# Patient Record
Sex: Male | Born: 1970 | Race: Black or African American | Hispanic: No | Marital: Single | Smoking: Current every day smoker
Health system: Southern US, Community
[De-identification: ages and names within clinical notes are randomized; demographics above are authoritative.]

## PROBLEM LIST (undated history)

## (undated) DIAGNOSIS — E7800 Pure hypercholesterolemia, unspecified: Secondary | ICD-10-CM

## (undated) DIAGNOSIS — Z5900 Homelessness unspecified: Secondary | ICD-10-CM

## (undated) DIAGNOSIS — I10 Essential (primary) hypertension: Secondary | ICD-10-CM

## (undated) DIAGNOSIS — F319 Bipolar disorder, unspecified: Secondary | ICD-10-CM

## (undated) DIAGNOSIS — G4730 Sleep apnea, unspecified: Secondary | ICD-10-CM

## (undated) DIAGNOSIS — F32A Depression, unspecified: Secondary | ICD-10-CM

## (undated) DIAGNOSIS — F329 Major depressive disorder, single episode, unspecified: Secondary | ICD-10-CM

---

## 2014-10-08 ENCOUNTER — Encounter (HOSPITAL_COMMUNITY): Payer: Self-pay | Admitting: Nurse Practitioner

## 2014-10-08 ENCOUNTER — Emergency Department (HOSPITAL_COMMUNITY): Payer: Self-pay

## 2014-10-08 ENCOUNTER — Emergency Department (HOSPITAL_COMMUNITY)
Admission: EM | Admit: 2014-10-08 | Discharge: 2014-10-08 | Disposition: A | Discharge status: Home / Self Care | Payer: Self-pay | Attending: Emergency Medicine | Admitting: Emergency Medicine

## 2014-10-08 DIAGNOSIS — Z8669 Personal history of other diseases of the nervous system and sense organs: Secondary | ICD-10-CM | POA: Insufficient documentation

## 2014-10-08 DIAGNOSIS — Z8639 Personal history of other endocrine, nutritional and metabolic disease: Secondary | ICD-10-CM | POA: Insufficient documentation

## 2014-10-08 DIAGNOSIS — Z720 Tobacco use: Secondary | ICD-10-CM | POA: Insufficient documentation

## 2014-10-08 DIAGNOSIS — I10 Essential (primary) hypertension: Secondary | ICD-10-CM | POA: Insufficient documentation

## 2014-10-08 DIAGNOSIS — J069 Acute upper respiratory infection, unspecified: Secondary | ICD-10-CM | POA: Insufficient documentation

## 2014-10-08 HISTORY — DX: Essential (primary) hypertension: I10

## 2014-10-08 HISTORY — DX: Sleep apnea, unspecified: G47.30

## 2014-10-08 HISTORY — DX: Pure hypercholesterolemia, unspecified: E78.00

## 2014-10-08 LAB — CBC
HEMATOCRIT: 40.2 % (ref 39.0–52.0)
Hemoglobin: 13.6 g/dL (ref 13.0–17.0)
MCH: 27.6 pg (ref 26.0–34.0)
MCHC: 33.8 g/dL (ref 30.0–36.0)
MCV: 81.5 fL (ref 78.0–100.0)
PLATELETS: 279 10*3/uL (ref 150–400)
RBC: 4.93 MIL/uL (ref 4.22–5.81)
RDW: 13.1 % (ref 11.5–15.5)
WBC: 8.4 10*3/uL (ref 4.0–10.5)

## 2014-10-08 LAB — BASIC METABOLIC PANEL
Anion gap: 9 (ref 5–15)
BUN: 8 mg/dL (ref 6–20)
CALCIUM: 9.1 mg/dL (ref 8.9–10.3)
CO2: 25 mmol/L (ref 22–32)
CREATININE: 1.19 mg/dL (ref 0.61–1.24)
Chloride: 105 mmol/L (ref 101–111)
GFR calc non Af Amer: >60 mL/min (ref >60)
Glucose, Bld: 96 mg/dL (ref 65–99)
Potassium: 3.4 mmol/L — ABNORMAL LOW (ref 3.5–5.1)
Sodium: 139 mmol/L (ref 135–145)

## 2014-10-08 NOTE — ED Notes (Signed)
Pt discharged by Buel ReamSara Slack, RN

## 2014-10-08 NOTE — ED Provider Notes (Signed)
CSN: [id number]     Arrival date & time 10/08/14  1114 History   First MD Initiated Contact with Patient 10/08/14 1543     Chief Complaint  Patient presents with  . Dizziness  . Cough     (Consider location/radiation/quality/duration/timing/severity/associated sxs/prior Treatment) HPI   Henry Hicks is a 44 y.o. male who presents for evaluation of rhinorrhea and cough present for 7 days. This morning at work, he felt dizzy, described as lightheaded, when bending over. He denies face pain, fever, chills, nausea, vomiting, isolated weakness or paresthesia. He has a history of hypertension, not currently being treated. He smokes cigarettes. He is been laid off from work until 2 days ago when he started working at [employer] job. Today before he felt dizzy, he noticed that an order from some of the metal pieces, he was working on, was bothering him. He denies chest pain, headache or back pain. He is essentially homeless now, but staying with a "married woman." He has used Xanax in the past, but does not have a current prescription for it. There are no other known modifying factors.   Past Medical History  Diagnosis Date  . Hypertension   . Hypercholesteremia   . Sleep apnea    History reviewed. No pertinent past surgical history. History reviewed. No pertinent family history. History  Substance Use Topics  . Smoking status: Current Every Day Smoker    Types: Cigarettes  . Smokeless tobacco: Not on file  . Alcohol Use: No    Review of Systems  All other systems reviewed and are negative.     Allergies  Review of patient's allergies indicates no known allergies.  Home Medications   Prior to Admission medications   Not on File   BP 142/90 mmHg  Pulse 79  Temp(Src) 98.7 F (37.1 C) (Oral)  Resp 18  Ht  (1.727 m)  Wt 200 lb 8 oz (90.946 kg)  BMI 30.49 kg/m2  SpO2 99% Physical Exam  Constitutional: He is oriented to person, place, and time. He appears  well-developed and well-nourished.  HENT:  Head: Normocephalic and atraumatic.  Right Ear: External ear normal.  Left Ear: External ear normal.  Eyes: Conjunctivae and EOM are normal. Pupils are equal, round, and reactive to light.  Neck: Normal range of motion and phonation normal. Neck supple.  Cardiovascular: Normal rate, regular rhythm and normal heart sounds.   Pulmonary/Chest: Effort normal and breath sounds normal. No respiratory distress. He exhibits no bony tenderness.  No wheezes, Rales or rhonchi. Good air bilaterally.  Abdominal: Soft. There is no tenderness.  Musculoskeletal: Normal range of motion.  Neurological: He is alert and oriented to person, place, and time. No cranial nerve deficit or sensory deficit. He exhibits normal muscle tone. Coordination normal.  Skin: Skin is warm, dry and intact.  Psychiatric: He has a normal mood and affect. His behavior is normal. Judgment and thought content normal.  Nursing note and vitals reviewed.   ED Course  Procedures (including critical care time)  Medications - No data to display  Patient Vitals for the past 24 hrs:  BP Temp Temp src Pulse Resp SpO2 Height Weight  10/08/14 1601 142/90 mmHg 98.7 F (37.1 C) Oral 79 18 99 % - -  10/08/14 1557 - - - - - -  (1.727 m) 200 lb 8 oz (90.946 kg)  10/08/14 1119 141/80 mmHg 98.2 F (36.8 C) Oral 90 16 97 % - -    4:15 PM  Reevaluation with update and discussion. After initial assessment and treatment, an updated evaluation reveals he is comfortable. Findings discussed with patient, all questions answered.Flint Melter. Tennyson Kallen L    Labs Review Labs Reviewed  BASIC METABOLIC PANEL - Abnormal; Notable for the following:    Potassium 3.4 (*)    All other components within normal limits  CBC    Imaging Review Dg Chest 2 View  10/08/2014   CLINICAL DATA:  Productive cough for 1 week  EXAM: CHEST  2 VIEW  COMPARISON:  None.  FINDINGS: Cardiac shadow is within normal limits. The  lungs are clear bilaterally. Scoliosis of the thoracic spine is noted concave to the right superiorly concave to the left inferiorly a safety pin is noted on the frontal film which projects adjacent to posterior chest wall.  IMPRESSION: No acute abnormality noted.   Electronically Signed   By: Alcide CleverMark  Lukens M.D.   On: 10/08/2014 12:15     EKG Interpretation None      MDM   Final diagnoses:  URI (upper respiratory infection)    Nonspecific respiratory symptoms, differential diagnosis includes virus and allergy. Doubt bacterial infection, pneumonia, or acute sinusitis. Stable psychiatric state, with history of anxiety, and homelessness, currently.  Nursing Notes Reviewed/ Care Coordinated Applicable Imaging Reviewed Interpretation of Laboratory Data incorporated into ED treatment  The patient appears reasonably screened and/or stabilized for discharge and I doubt any other medical condition or other [facility] requiring further screening, evaluation, or treatment in the ED at this time prior to discharge.  Plan: Home Medications- OTC antihistamine; Home Treatments- rest, fluids; return here if the recommended treatment, does not improve the symptoms; Recommended follow up- PCP 2-3 weeks. [facility] for Psychiatric evaluation and stress management.     Mancel BaleElliott Rasmus Preusser, MD 10/08/14 [phone]

## 2014-10-08 NOTE — ED Notes (Signed)
He states he has not felt well this past week, hes had a productive cough with yellow sputum and feeling lightheaded and nauseated.  He feels like his BP might be elevated. He has not taken his BP medication for 3 years. He is A&Ox4, resp e/u

## 2014-10-08 NOTE — Discharge Instructions (Signed)
The cause of your dizziness, is not clear. It will help to drink plenty of fluids. Consider trying an anti-histamine such as Benadryl or Zyrtec, see if it helps your cough and runny nose. Also, follow-up at [facility], for a checkup, regarding your stress and nervousness. You should also use our resource guide to help you find a primary care doctor to see for ongoing management. You should try to have your blood pressure and cholesterol checked in the next few weeks.    Upper Respiratory Infection, Adult An upper respiratory infection (URI) is also sometimes known as the common cold. The upper respiratory tract includes the nose, sinuses, throat, trachea, and bronchi. Bronchi are the airways leading to the lungs. Most people improve within 1 week, but symptoms can last up to 2 weeks. A residual cough may last even longer.  CAUSES Many different viruses can infect the tissues lining the upper respiratory tract. The tissues become irritated and inflamed and often become very moist. Mucus production is also common. A cold is contagious. You can easily spread the virus to others by oral contact. This includes kissing, sharing a glass, coughing, or sneezing. Touching your mouth or nose and then touching a surface, which is then touched by another person, can also spread the virus. SYMPTOMS  Symptoms typically develop 1 to 3 days after you come in contact with a cold virus. Symptoms vary from person to person. They may include:  Runny nose.  Sneezing.  Nasal congestion.  Sinus irritation.  Sore throat.  Loss of voice (laryngitis).  Cough.  Fatigue.  Muscle aches.  Loss of appetite.  Headache.  Low-grade fever. DIAGNOSIS  You might diagnose your own cold based on familiar symptoms, since most people get a cold 2 to 3 times a year. Your caregiver can confirm this based on your exam. Most importantly, your caregiver can check that your symptoms are not due to another disease such as strep  throat, sinusitis, pneumonia, asthma, or epiglottitis. Blood tests, throat tests, and X-rays are not necessary to diagnose a common cold, but they may sometimes be helpful in excluding other more serious diseases. Your caregiver will decide if any further tests are required. RISKS AND COMPLICATIONS  You may be at risk for a more severe case of the common cold if you smoke cigarettes, have chronic heart disease (such as heart failure) or lung disease (such as asthma), or if you have a weakened immune system. The very young and very old are also at risk for more serious infections. Bacterial sinusitis, middle ear infections, and bacterial pneumonia can complicate the common cold. The common cold can worsen asthma and chronic obstructive pulmonary disease (COPD). Sometimes, these complications can require emergency medical care and may be life-threatening. PREVENTION  The best way to protect against getting a cold is to practice good hygiene. Avoid oral or hand contact with people with cold symptoms. Wash your hands often if contact occurs. There is no clear evidence that vitamin C, vitamin E, echinacea, or exercise reduces the chance of developing a cold. However, it is always recommended to get plenty of rest and practice good nutrition. TREATMENT  Treatment is directed at relieving symptoms. There is no cure. Antibiotics are not effective, because the infection is caused by a virus, not by bacteria. Treatment may include:  Increased fluid intake. Sports drinks offer valuable electrolytes, sugars, and fluids.  Breathing heated mist or steam (vaporizer or shower).  Eating chicken soup or other clear broths, and maintaining good nutrition.  Getting plenty of rest.  Using gargles or lozenges for comfort.  Controlling fevers with ibuprofen or acetaminophen as directed by your caregiver.  Increasing usage of your inhaler if you have asthma. Zinc gel and zinc lozenges, taken in the first 24 hours of  the common cold, can shorten the duration and lessen the severity of symptoms. Pain medicines may help with fever, muscle aches, and throat pain. A variety of non-prescription medicines are available to treat congestion and runny nose. Your caregiver can make recommendations and may suggest nasal or lung inhalers for other symptoms.  HOME CARE INSTRUCTIONS   Only take over-the-counter or prescription medicines for pain, discomfort, or fever as directed by your caregiver.  Use a warm mist humidifier or inhale steam from a shower to increase air moisture. This may keep secretions moist and make it easier to breathe.  Drink enough water and fluids to keep your urine clear or pale yellow.  Rest as needed.  Return to work when your temperature has returned to normal or as your caregiver advises. You may need to stay home longer to avoid infecting others. You can also use a face mask and careful hand washing to prevent spread of the virus. SEEK MEDICAL CARE IF:   After the first few days, you feel you are getting worse rather than better.    You need your caregiver's advice about medicines to control symptoms.  You develop chills, worsening shortness of breath, or brown or red sputum. These may be signs of pneumonia.  You develop yellow or brown nasal discharge or pain in the face, especially when you bend forward. These may be signs of sinusitis.  You develop a fever, swollen neck glands, pain with swallowing, or white areas in the back of your throat. These may be signs of strep throat. SEEK IMMEDIATE MEDICAL CARE IF:   You have a fever.  You develop severe or persistent headache, ear pain, sinus pain, or chest pain.  You develop wheezing, a prolonged cough, cough up blood, or have a change in your usual mucus (if you have chronic lung disease).  You develop sore muscles or a stiff neck. Document Released: 10/27/2000 Document Revised: 07/26/2011 Document Reviewed: 08/08/2013 [facility]  Patient Information 2015 [place], [place]. This information is not intended to replace advice given to you by your health care provider. Make sure you discuss any questions you have with your health care provider.     Emergency Department Resource Guide 1) Find a Doctor and Pay Out of Pocket Although you won't have to find out who is covered by your insurance plan, it is a good idea to ask around and get recommendations. You will then need to call the office and see if the doctor you have chosen will accept you as a new patient and what types of options they offer for patients who are self-pay. Some doctors offer discounts or will set up payment plans for their patients who do not have insurance, but you will need to ask so you aren't surprised when you get to your appointment.  2) Contact Your Local Health Department Not all health departments have doctors that can see patients for sick visits, but many do, so it is worth a call to see if yours does. If you don't know where your local health department is, you can check in your phone book. The CDC also has a tool to help you locate your state's health department, and many state websites also have listings of all  of their local health departments.  3) Find a Walk-in Clinic If your illness is not likely to be very severe or complicated, you may want to try a walk in clinic. These are popping up all over the country in pharmacies, drugstores, and shopping centers. They're usually staffed by nurse practitioners or physician assistants that have been trained to treat common illnesses and complaints. They're usually fairly quick and inexpensive. However, if you have serious medical issues or chronic medical problems, these are probably not your best option.  No Primary Care Doctor: - Call Health Connect at  [phone] - they can help you locate a primary care doctor that  accepts your insurance, provides certain services, etc. - Physician Referral Service-  [phone]  Chronic Pain Problems: Organization         Address  Phone   Notes  [facility]  [phone] Patients need to be referred by their primary care doctor.   Medication Assistance: Organization         Address  Phone   Notes  [facility] Medication [facility] [address] [place] [place], [place] [address] [phone] --Must be a resident of [facility] -- Must have NO insurance coverage whatsoever (no Medicaid/ Medicare, etc.) -- The pt. MUST have a primary care doctor that directs their care regularly and follows them in the community   [facility]  [phone]   [employer]  [phone]    Agencies that provide inexpensive medical care: Organization         Address  Phone   Notes  [facility]  [phone]   [facility]    [phone]   [facility] [address] [place], [place] [address] [phone]   [facility] [address] [place] [phone]   Planned Parenthood    [phone]   [facility]    [phone]   Community Health and [facility]  [address], [place] Phone:  [phone], Fax:  [phone] Hours of Operation:  9 am - 6 pm, M-F.  Also accepts Medicaid/Medicare and self-pay.  [facility]  [address], [place] Phone: [phone], Fax: [phone]. Hours of Operation:  8:30 am - 5:30 pm, M-F.  Also accepts Medicaid and self-pay.  [facility] [place] [address] [place] Phone: [phone]   [facility] [address] Natasha Bence [place], [place] [phone], Ext. [phone] Mondays & Thursdays: 7-9 AM.  First 15 patients are seen on a first come, first serve basis.    Medicaid-accepting [facility] Providers:  Organization         Address  Phone   Notes  [facility] [address],  [place] [phone] Also accepts self-pay patients.  [facility] [address] Laurell Josephs [place], [place]  [phone]   [facility] [address], [place] [phone]   [facility] [address], [place] [phone]   Renaye Rakers [address], [place]   [phone] Only accepts [place] Access [place] patients after they have their name applied to their card.   Self-Pay (no insurance) in [facility]:  Organization         Address  Phone   Notes  Sickle  Cell Patients, [facility] Internal Medicine [address] [place], [place] [phone]   [facility] [address] [place], [place] [phone]   [facility] [place]  [address], [place] [phone]   [facility]/Dr. Osei-Bonsu  [address] [place] or [address], [place] [phone] Phone number for both [place] and [place] locations is the same.  [facility]gent [facility] and [facility] [address], [place] [phone]   [facility] [address] [place] or [address] [phone]   [facility] [address] [place] [phone], phone; [phone], fax Sees patients 1st and 3rd Saturday of every month.  Must not qualify for public or private insurance (i.e. Medicaid, Medicare, [place] Health Choice, Veterans' Benefits)  Household income should be no more than 200% of the poverty level The clinic cannot treat you if you are pregnant or think you are pregnant  Sexually transmitted diseases are not treated at the clinic.    Dental Care: Organization         Address  Phone  Notes  [facility] Department of [facility] [address] [place], [place] [phone] Accepts children up to age 52 who are enrolled in  [place] or [place] Health Choice; pregnant women with a Medicaid card; and children who have applied for Medicaid or [place] Health Choice, but were declined, whose parents can pay a reduced fee at time of service.  [facility] Department of [facility]  [address], [place] [phone] Accepts children up to age 50 who are enrolled in [place] or [place] Health Choice; pregnant women with a Medicaid card; and children who have applied for Medicaid or [place] Health Choice, but were declined, whose parents can pay a reduced fee at time of service.  [facility]  [address] [place], [place] [phone] Patients are seen by appointment only. Walk-ins are not accepted. [facility] will see patients 34 years of age and older. Monday - Tuesday (8am-5pm) Most Wednesdays (8:30-5pm) $30 per visit, cash only  [facility] Adult Dental Access PROGRAM  [address], [facility] [phone] Patients are seen by appointment only. Walk-ins are not accepted. [facility] will see patients 69 years of age and older. One Wednesday Evening (Monthly: Volunteer Based).  $30 per visit, cash only  [employer]  [phone] for adults; Children under age 57, call Graduate Pediatric Dentistry at [phone]. Children aged 52-14, please call [phone] to request a pediatric application.  Dental services are provided in all areas of dental care including fillings, crowns and bridges, complete and partial dentures, implants, gum treatment, root canals, and extractions. Preventive care is also provided. Treatment is provided to both adults and children. Patients are selected via a lottery and there is often a waiting list.   [facility] [address] [place]  [phone] [email or web address]   [facility] [address] [place], [place] [phone], Ext. [phone] Second and Fourth Thursday of each month, opens at 6:30  AM; Clinic ends at 9 AM.  Patients are seen on a first-come first-served basis, and a limited number are seen during each clinic.   [facility]  [address] Ether Griffins [place], [place] [phone]   Eligibility Requirements  You must have lived in [place], [place], or [place] counties for at least the last three months.   You cannot be eligible for state or federal sponsored [place], including [employer], [place], or [employer].   You generally cannot be eligible for healthcare insurance through your employer.    How to apply: Eligibility screenings are held every Tuesday and Wednesday afternoon from 1:00 pm until 4:00 pm. You do not need an appointment for the interview!  [facility] [address] [place], [place] [phone]   [facility] Health Department  [phone]   [facility] Health Department  [phone]   [facility] Health Department  [phone]    Behavioral Health Resources in the Community: Intensive Outpatient Programs Organization         Address  Phone  Notes  [facility] Services [address] [place], [place] [phone]   [facility] Outpatient [address], [place], [place] [phone]   [facility] [address], [place], [place]  [phone]   [facility] Health [address]  [place], [place] [phone] or [phone]   Substance Abuse Resources Organization         Address  Phone  Notes  [facility] Drug Services  [phone]   [facility]  [phone]   [facility] [place]  [phone]   Floydene Flock  [phone]   Residential & Outpatient Substance Abuse Program  [phone]   Psychological Services Organization         Address  Phone  Notes  [facility] Behavioral Health  [phone]   [facility] Services  [phone]   [facility] Mental Health [address] [place] [phone] or  [phone]    Mobile Crisis Teams Organization         Address  Phone  Notes  [facility], Mobile Crisis Care Unit  [phone]   [facility]  [address] [place], [place] [phone]   Doristine Locks [address] [place] [address] [place] [phone]    Self-Help/Support Groups Organization         Address  Phone             Notes  Mental Health Assoc. of  - variety of support groups  336- I7437963 Call for more information  Narcotics Anonymous (NA), [facility] [address], [employer] [place]  2 meetings at this location   Statistician         Address  Phone  Notes  [facility] [address],    [place]  [phone]   [employer]  [address], [place] [address], [place], [place] [phone]   [facility] [address] [place], [place] [phone] Admissions: 8am-3pm M-F  [facility] [address],    [place], [place] [phone]   [facility] [address] [place], [place], [place] [phone]   [facility] [address]  [place], [place] [phone]   [facility] - Intensive Outpatient [address] Laurell Josephs [address], [place], [place] [phone]   [facility] (Addiction Recovery Care [facility] [address] [place]  [place], [place] [phone] or [phone]   [facility] ([facility]) [address] [place], [place] [phone] Accepts Medicaid  [facility] [address],  [place] [phone] Substance Abuse/Addiction Treatment   [facility] Resources Organization         Address  Phone  Notes  [facility]  [phone]   Angie Fava, PhD [address] Ervin Knack [place], [place]   9387848328 or [phone]   [facility] Behavioral   [address] [place], [place] [phone]   [facility] [address],  [place], [place] [phone] Insurance/Medicaid/sponsorship through [facility] and Families [address]                                    [place], [place] [phone] Therapy/tele-psych/case  [facility] [address], [place] [phone]    Dr. Lolly Mustache  [phone]   [facility]  [employer] [facility] Dept. 1) [address] [place] 2) [address] [place] 3)  [address], [place] [phone]  [phone]   [facility] Child Abuse Hotline (248)[phone] or [phone] (After Hours)

## 2015-01-03 ENCOUNTER — Encounter (HOSPITAL_COMMUNITY): Payer: Self-pay | Admitting: Emergency Medicine

## 2015-01-03 ENCOUNTER — Emergency Department (HOSPITAL_COMMUNITY)
Admission: EM | Admit: 2015-01-03 | Discharge: 2015-01-03 | Disposition: A | Discharge status: Home / Self Care | Payer: Self-pay | Attending: Emergency Medicine | Admitting: Emergency Medicine

## 2015-01-03 DIAGNOSIS — Z720 Tobacco use: Secondary | ICD-10-CM | POA: Insufficient documentation

## 2015-01-03 DIAGNOSIS — I10 Essential (primary) hypertension: Secondary | ICD-10-CM | POA: Insufficient documentation

## 2015-01-03 DIAGNOSIS — F1410 Cocaine abuse, uncomplicated: Secondary | ICD-10-CM | POA: Insufficient documentation

## 2015-01-03 DIAGNOSIS — Z8659 Personal history of other mental and behavioral disorders: Secondary | ICD-10-CM

## 2015-01-03 DIAGNOSIS — Z8639 Personal history of other endocrine, nutritional and metabolic disease: Secondary | ICD-10-CM | POA: Insufficient documentation

## 2015-01-03 DIAGNOSIS — Z8669 Personal history of other diseases of the nervous system and sense organs: Secondary | ICD-10-CM | POA: Insufficient documentation

## 2015-01-03 LAB — COMPREHENSIVE METABOLIC PANEL
ALT: 18 U/L (ref 17–63)
AST: 19 U/L (ref 15–41)
Albumin: 4.1 g/dL (ref 3.5–5.0)
Alkaline Phosphatase: 68 U/L (ref 38–126)
Anion gap: 10 (ref 5–15)
BUN: 9 mg/dL (ref 6–20)
CO2: 26 mmol/L (ref 22–32)
Calcium: 9.3 mg/dL (ref 8.9–10.3)
Chloride: 106 mmol/L (ref 101–111)
Creatinine, Ser: 1.31 mg/dL — ABNORMAL HIGH (ref 0.61–1.24)
GFR calc Af Amer: >60 mL/min (ref >60)
GFR calc non Af Amer: >60 mL/min (ref >60)
Glucose, Bld: 90 mg/dL (ref 65–99)
Potassium: 3.7 mmol/L (ref 3.5–5.1)
Sodium: 142 mmol/L (ref 135–145)
Total Bilirubin: 0.6 mg/dL (ref 0.3–1.2)
Total Protein: 7.4 g/dL (ref 6.5–8.1)

## 2015-01-03 LAB — RAPID URINE DRUG SCREEN, HOSP PERFORMED
AMPHETAMINES: NOT DETECTED
Barbiturates: NOT DETECTED
Benzodiazepines: NOT DETECTED
Cocaine: POSITIVE — AB
OPIATES: NOT DETECTED
Tetrahydrocannabinol: NOT DETECTED

## 2015-01-03 LAB — CBC
HCT: 40.7 % (ref 39.0–52.0)
HEMOGLOBIN: 13.6 g/dL (ref 13.0–17.0)
MCH: 27.7 pg (ref 26.0–34.0)
MCHC: 33.4 g/dL (ref 30.0–36.0)
MCV: 82.9 fL (ref 78.0–100.0)
Platelets: 233 10*3/uL (ref 150–400)
RBC: 4.91 MIL/uL (ref 4.22–5.81)
RDW: 13.8 % (ref 11.5–15.5)
WBC: 9.8 10*3/uL (ref 4.0–10.5)

## 2015-01-03 MED ORDER — HYDROXYZINE HCL 25 MG PO TABS: 25.0000 mg | ORAL_TABLET | Freq: Four times a day (QID) | ORAL | Status: DC | PRN

## 2015-01-03 MED ORDER — HYDROXYZINE HCL 25 MG PO TABS
25.0000 mg | ORAL_TABLET | Freq: Once | ORAL | Status: AC
  Administered 2015-01-03: 25 mg via ORAL
  Filled 2015-01-03: qty 1

## 2015-01-03 NOTE — Discharge Instructions (Signed)
Chemical Dependency Chemical dependency is an addiction to drugs or alcohol. It is characterized by the repeated behavior of seeking out and using drugs and alcohol despite harmful consequences to the health and safety of ones self and others.  RISK FACTORS There are certain situations or behaviors that increase a person's risk for chemical dependency. These include:  A family history of chemical dependency.  A history of mental health issues, including depression and anxiety.  A home environment where drugs and alcohol are easily available to you.  Drug or alcohol use at a young age. SYMPTOMS  The following symptoms can indicate chemical dependency:  Inability to limit the use of drugs or alcohol.  Nausea, sweating, shakiness, and anxiety that occurs when alcohol or drugs are not being used.  An increase in amount of drugs or alcohol that is necessary to get drunk or high. People who experience these symptoms can assess their use of drugs and alcohol by asking themselves the following questions:  Have you been told by friends or family that they are worried about your use of alcohol or drugs?  Do friends and family ever tell you about things you did while drinking alcohol or using drugs that you do not remember?  Do you lie about using alcohol or drugs or about the amounts you use?  Do you have difficulty completing daily tasks unless you use alcohol or drugs?  Is the level of your work or school performance lower because of your drug or alcohol use?  Do you get sick from using drugs or alcohol but keep using anyway?  Do you feel uncomfortable in social situations unless you use alcohol or drugs?  Do you use drugs or alcohol to help forget problems? An answer of yes to any of these questions may indicate chemical dependency. Professional evaluation is suggested. Document Released: 04/27/2001 Document Revised: 07/26/2011 Document Reviewed: 07/09/2010 [facility] Patient  Information 2015 [place], [place]. This information is not intended to replace advice given to you by your health care provider. Make sure you discuss any questions you have with your health care provider.  Stimulant Use Disorder-Cocaine Cocaine is one of a group of powerful drugs called stimulants. Cocaine has medical uses for stopping nosebleeds and for pain control before minor nose or dental surgery. However, cocaine is misused because of the effects that it produces. These effects include:   A feeling of extreme pleasure.  Alertness.  High energy. Common street names for cocaine include coke, crack, blow, snow, and nose candy. Cocaine is snorted, dissolved in water and injected, or smoked.  Stimulants are addictive because they activate regions of the brain that produce both the pleasurable sensation of "reward" and psychological dependence. Together, these actions account for loss of control and the rapid development of drug dependence. This means you become ill without the drug (withdrawal) and need to keep using it to function.  Stimulant use disorder is use of stimulants that disrupts your daily life. It disrupts relationships with family and friends and how you do your job. Cocaine increases your blood pressure and heart rate. It can cause a heart attack or stroke. Cocaine can also cause death from irregular heart rate or seizures. SYMPTOMS Symptoms of stimulant use disorder with cocaine include:  Use of cocaine in larger amounts or over a longer period of time than intended.  Unsuccessful attempts to cut down or control cocaine use.  A lot of time spent obtaining, using, or recovering from the effects of cocaine.  A  strong desire or urge to use cocaine (craving).  Continued use of cocaine in spite of major problems at work, school, or home because of use.  Continued use of cocaine in spite of relationship problems because of use.  Giving up or cutting down on important life  activities because of cocaine use.  Use of cocaine over and over in situations when it is physically hazardous, such as driving a car.  Continued use of cocaine in spite of a physical problem that is likely related to use. Physical problems can include:  Malnutrition.  Nosebleeds.  Chest pain.  High blood pressure.  A hole that develops between the part of your nose that separates your nostrils (perforated nasal septum).  Lung and kidney damage.  Continued use of cocaine in spite of a mental problem that is likely related to use. Mental problems can include:  Schizophrenia-like symptoms.  Depression.  Bipolar mood swings.  Anxiety.  Sleep problems.  Need to use more and more cocaine to get the same effect, or lessened effect over time with use of the same amount of cocaine (tolerance).  Having withdrawal symptoms when cocaine use is stopped, or using cocaine to reduce or avoid withdrawal symptoms. Withdrawal symptoms include:  Depressed or irritable mood.  Low energy or restlessness.  Bad dreams.  Poor or excessive sleep.  Increased appetite. DIAGNOSIS Stimulant use disorder is diagnosed by your health care provider. You may be asked questions about your cocaine use and how it affects your life. A physical exam may be done. A drug screen may be ordered. You may be referred to a mental health professional. The diagnosis of stimulant use disorder requires at least two symptoms within 12 months. The type of stimulant use disorder depends on the number of signs and symptoms you have. The type may be:  Mild. Two or three signs and symptoms.  Moderate. Four or five signs and symptoms.  Severe. Six or more signs and symptoms. TREATMENT Treatment for stimulant use disorder is usually provided by mental health professionals with training in substance use disorders. The following options are available:  Counseling or talk therapy. Talk therapy addresses the reasons you use  cocaine and ways to keep you from using again. Goals of talk therapy include:  Identifying and avoiding triggers for use.  Handling cravings.  Replacing use with healthy activities.  Support groups. Support groups provide emotional support, advice, and guidance.  Medicine. Certain medicines may decrease cocaine cravings or withdrawal symptoms. HOME CARE INSTRUCTIONS  Take medicines only as directed by your health care provider.  Identify the people and activities that trigger your cocaine use and avoid them.  Keep all follow-up visits as directed by your health care provider. SEEK MEDICAL CARE IF:  Your symptoms get worse or you relapse.  You are not able to take medicines as directed. SEEK IMMEDIATE MEDICAL CARE IF:  You have serious thoughts about hurting yourself or others.  You have a seizure, chest pain, sudden weakness, or loss of speech or vision. FOR MORE INFORMATION  National Institute on Drug Abuse: http://www.price-smith.com/  Substance Abuse and Mental Health Services Administration: SkateOasis.com.pt Document Released: 04/30/2000 Document Revised: 09/17/2013 Document Reviewed: 05/16/2013 [facility] Patient Information 2015 [place], [place]. This information is not intended to replace advice given to you by your health care provider. Make sure you discuss any questions you have with your health care provider.    Behavioral Health Resources in the [facility]  Intensive Outpatient Programs: [facility]  [address] [place], [place] [phone] Both a day and evening program       [facility]     [address]        [place], [place] [address] [phone]         ADS: [facility] [address] [place] [phone]  [facility] Mental Health ACCESS LINE: [phone] or [phone] [address] [place], [place] [address] [email or web address]  Mobile  Crisis Teams:                                        [facility]         Mobile Crisis Care Unit [phone]             [facility] [address] Ginette Otto [phone]                                         [facility] [address] [place] [address] [place] [phone]  Self-Help/Support Groups: [facility] of [employer] of support groups [phone] (call for more info)  Narcotics Anonymous (NA) [facility] [address] [place] - 2 meetings at this location  Residential Treatment Programs:  [facility]      [address]        [place]       [phone]         [facility] [address] [place] [address] [place], [place]  [address] [phone]  [facility]  [address] [place], [place] [address] [phone] Admissions: 8am-3pm M-F  [facility]     [address]        [place], [place] [address]       [phone]         [facility] [address] [place], [place] [phone]  [facility] [address] [place], [place] [phone]  [facility] - Intensive Outpatient      [address]     [place], [place]       [phone]         [facility] ([facility]     [address] [place], [place] [phone] or [phone]  [facility] ([facility])  [address] [place], [place] [phone]  [facility] [address]                                               [address] [place] [phone]  [facility] Resources: [facility]970 [phone]               General Therapy                                                Angie Fava, PhD        [phone]  [place], [place] [address]         [phone]   Insurance  [facility]   [address] [place], [place] [address] [phone]  [facility] [address] [place], [place] [address] [phone] Insurance/Medicaid/sponsorship through [facility] and Families                                              [address]                                        [place], [place] [address]    Therapy/tele-psych/case         [phone]          [facility] [address], [place]  [address]  Adolescent/group home/case management [phone]                                           Creola Corn PhD       General therapy       Insurance   [phone]         Dr. Lolly Mustache Insurance [phone] M-F  [facility] Medicaid, sponsorship [id number]

## 2015-01-03 NOTE — ED Notes (Signed)
MD at bedside.otter

## 2015-01-03 NOTE — ED Provider Notes (Signed)
CSN: [id number]     Arrival date & time 01/03/15  2235 History  This chart was scribed for Henry Severin, MD by Tanda Rockers, ED Scribe. This patient was seen in room A06C/A06C and the patient's care was started at 11:05 PM.  Chief Complaint  Patient presents with  . Drug Problem   The history is provided by the patient. No language interpreter was used.     HPI Comments: Henry Hicks is a 44 y.o. male with hx bipolar disorder and depression who presents to the Emergency Department for detox from cocaine. He reports that his cocaine use is intermittent, stating that he uses probably 1-2 days in a row and then won't use again for approximately 1 month. Pt reports he still has a craving for cocaine and would like help reduce that craving. Pt states he hasn't taken his psychiatric medication in 1 year as well and has been feeling very anxious lately because of not taking his medication. He is also here for a prescription for his bipolar disorder and depression. He denies having chest pain, cough, shortness of breath, or any other symptoms currently.   Past Medical History  Diagnosis Date  . Hypertension   . Hypercholesteremia   . Sleep apnea    History reviewed. No pertinent past surgical history. No family history on file. Social History  Substance Use Topics  . Smoking status: Current Every Day Smoker    Types: Cigarettes  . Smokeless tobacco: None  . Alcohol Use: No    Review of Systems  Respiratory: Negative for cough and shortness of breath.   Cardiovascular: Negative for chest pain.  Psychiatric/Behavioral: The patient is nervous/anxious.   All other systems reviewed and are negative.  Allergies  Review of patient's allergies indicates no known allergies.  Home Medications   Prior to Admission medications   Not on File   Triage Vitals: BP 132/79 mmHg  Pulse 75  Resp 16  SpO2 98%   Physical Exam  Constitutional: He is oriented to person, place, and time. He appears  well-developed and well-nourished.  HENT:  Head: Normocephalic and atraumatic.  Nose: Nose normal.  Mouth/Throat: Oropharynx is clear and moist.  Eyes: Conjunctivae and EOM are normal. Pupils are equal, round, and reactive to light.  Neck: Normal range of motion. Neck supple. No JVD present. No tracheal deviation present. No thyromegaly present.  Cardiovascular: Normal rate, regular rhythm, normal heart sounds and intact distal pulses.  Exam reveals no gallop and no friction rub.   No murmur heard. Pulmonary/Chest: Effort normal and breath sounds normal. No stridor. No respiratory distress. He has no wheezes. He has no rales. He exhibits no tenderness.  Abdominal: Soft. Bowel sounds are normal. He exhibits no distension and no mass. There is no tenderness. There is no rebound and no guarding.  Musculoskeletal: Normal range of motion. He exhibits no edema or tenderness.  Lymphadenopathy:    He has no cervical adenopathy.  Neurological: He is alert and oriented to person, place, and time. He displays normal reflexes. He exhibits normal muscle tone. Coordination normal.  Skin: Skin is warm and dry. No rash noted. No erythema. No pallor.  Psychiatric: He has a normal mood and affect. His behavior is normal. Judgment and thought content normal.  Nursing note and vitals reviewed.   ED Course  Procedures (including critical care time)  DIAGNOSTIC STUDIES: Oxygen Saturation is 98% on RA, normal by my interpretation.    COORDINATION OF CARE: 11:13 PM-Discussed treatment plan which  includes resources guide with pt at bedside and pt agreed to plan.   Labs Review Labs Reviewed  COMPREHENSIVE METABOLIC PANEL - Abnormal; Notable for the following:    Creatinine, Ser 1.31 (*)    All other components within normal limits  URINE RAPID DRUG SCREEN, HOSP PERFORMED - Abnormal; Notable for the following:    Cocaine POSITIVE (*)    All other components within normal limits  CBC    Imaging  Review No results found. I have personally reviewed and evaluated these images and lab results as part of my medical decision-making.   EKG Interpretation None      MDM   Final diagnoses:  History of bipolar disorder  Cocaine abuse    I personally performed the services described in this documentation, which was scribed in my presence. The recorded information has been reviewed and is accurate.  44 year old male gives history of bipolar disorder, mainly anxiety component who abuses cocaine.  He is wishing help to come off of cocaine.  Patient given outpatient resources.  He is requesting Xanax, which I am not comfortable giving this time.  Patient to be provided.  Hydroxyzine.     Henry Severin, MD 01/04/15 [phone]

## 2015-01-03 NOTE — ED Notes (Addendum)
Patient here for help, would like to detox from cocaine.  Patient is off his medications at this time.  He states that he is bipolar and has panic attacks.  He states that he last used last night.

## 2015-04-01 ENCOUNTER — Emergency Department (HOSPITAL_COMMUNITY): Payer: Self-pay

## 2015-04-01 ENCOUNTER — Emergency Department (HOSPITAL_COMMUNITY)
Admission: EM | Admit: 2015-04-01 | Discharge: 2015-04-02 | Disposition: A | Discharge status: Other Acute Inpatient Hospital | Payer: Self-pay | Attending: Emergency Medicine | Admitting: Emergency Medicine

## 2015-04-01 ENCOUNTER — Encounter (HOSPITAL_COMMUNITY): Payer: Self-pay | Admitting: Emergency Medicine

## 2015-04-01 DIAGNOSIS — R059 Cough, unspecified: Secondary | ICD-10-CM

## 2015-04-01 DIAGNOSIS — F1490 Cocaine use, unspecified, uncomplicated: Secondary | ICD-10-CM

## 2015-04-01 DIAGNOSIS — I10 Essential (primary) hypertension: Secondary | ICD-10-CM | POA: Insufficient documentation

## 2015-04-01 DIAGNOSIS — R300 Dysuria: Secondary | ICD-10-CM | POA: Insufficient documentation

## 2015-04-01 DIAGNOSIS — Z720 Tobacco use: Secondary | ICD-10-CM

## 2015-04-01 DIAGNOSIS — Z5900 Homelessness unspecified: Secondary | ICD-10-CM

## 2015-04-01 DIAGNOSIS — K5900 Constipation, unspecified: Secondary | ICD-10-CM | POA: Insufficient documentation

## 2015-04-01 DIAGNOSIS — R45851 Suicidal ideations: Secondary | ICD-10-CM | POA: Insufficient documentation

## 2015-04-01 DIAGNOSIS — R079 Chest pain, unspecified: Secondary | ICD-10-CM | POA: Insufficient documentation

## 2015-04-01 DIAGNOSIS — F3130 Bipolar disorder, current episode depressed, mild or moderate severity, unspecified: Secondary | ICD-10-CM | POA: Insufficient documentation

## 2015-04-01 DIAGNOSIS — R9431 Abnormal electrocardiogram [ECG] [EKG]: Secondary | ICD-10-CM | POA: Insufficient documentation

## 2015-04-01 DIAGNOSIS — Z8639 Personal history of other endocrine, nutritional and metabolic disease: Secondary | ICD-10-CM | POA: Insufficient documentation

## 2015-04-01 DIAGNOSIS — F17210 Nicotine dependence, cigarettes, uncomplicated: Secondary | ICD-10-CM | POA: Insufficient documentation

## 2015-04-01 DIAGNOSIS — R05 Cough: Secondary | ICD-10-CM | POA: Insufficient documentation

## 2015-04-01 DIAGNOSIS — F1410 Cocaine abuse, uncomplicated: Secondary | ICD-10-CM | POA: Insufficient documentation

## 2015-04-01 HISTORY — DX: Major depressive disorder, single episode, unspecified: F32.9

## 2015-04-01 HISTORY — DX: Depression, unspecified: F32.A

## 2015-04-01 HISTORY — DX: Bipolar disorder, unspecified: F31.9

## 2015-04-01 LAB — RAPID URINE DRUG SCREEN, HOSP PERFORMED
AMPHETAMINES: NOT DETECTED
Barbiturates: NOT DETECTED
Benzodiazepines: NOT DETECTED
COCAINE: POSITIVE — AB
OPIATES: NOT DETECTED
TETRAHYDROCANNABINOL: NOT DETECTED

## 2015-04-01 LAB — COMPREHENSIVE METABOLIC PANEL
ALBUMIN: 3.9 g/dL (ref 3.5–5.0)
ALT: 13 U/L — ABNORMAL LOW (ref 17–63)
ANION GAP: 7 (ref 5–15)
AST: 17 U/L (ref 15–41)
Alkaline Phosphatase: 64 U/L (ref 38–126)
BUN: 10 mg/dL (ref 6–20)
CHLORIDE: 106 mmol/L (ref 101–111)
CO2: 28 mmol/L (ref 22–32)
Calcium: 9.2 mg/dL (ref 8.9–10.3)
Creatinine, Ser: 1.22 mg/dL (ref 0.61–1.24)
GFR calc non Af Amer: >60 mL/min (ref >60)
GLUCOSE: 102 mg/dL — AB (ref 65–99)
Potassium: 3.4 mmol/L — ABNORMAL LOW (ref 3.5–5.1)
Sodium: 141 mmol/L (ref 135–145)
Total Bilirubin: 0.4 mg/dL (ref 0.3–1.2)
Total Protein: 6.6 g/dL (ref 6.5–8.1)

## 2015-04-01 LAB — CBC
HEMATOCRIT: 41.3 % (ref 39.0–52.0)
HEMOGLOBIN: 13.3 g/dL (ref 13.0–17.0)
MCH: 27.4 pg (ref 26.0–34.0)
MCHC: 32.2 g/dL (ref 30.0–36.0)
MCV: 85.2 fL (ref 78.0–100.0)
Platelets: 254 10*3/uL (ref 150–400)
RBC: 4.85 MIL/uL (ref 4.22–5.81)
RDW: 13.9 % (ref 11.5–15.5)
WBC: 8.1 10*3/uL (ref 4.0–10.5)

## 2015-04-01 LAB — URINALYSIS, ROUTINE W REFLEX MICROSCOPIC
GLUCOSE, UA: NEGATIVE mg/dL
Hgb urine dipstick: NEGATIVE
Ketones, ur: NEGATIVE mg/dL
Leukocytes, UA: NEGATIVE
Nitrite: NEGATIVE
Protein, ur: NEGATIVE mg/dL
Specific Gravity, Urine: 1.028 (ref 1.005–1.030)
pH: 6 (ref 5.0–8.0)

## 2015-04-01 LAB — ETHANOL: Alcohol, Ethyl (B): <5 mg/dL (ref <5)

## 2015-04-01 LAB — ACETAMINOPHEN LEVEL

## 2015-04-01 LAB — I-STAT TROPONIN, ED: Troponin i, poc: 0 ng/mL (ref 0.00–0.08)

## 2015-04-01 LAB — SALICYLATE LEVEL

## 2015-04-01 MED ORDER — NITROGLYCERIN 0.4 MG SL SUBL: 0.4000 mg | SUBLINGUAL_TABLET | SUBLINGUAL | Status: DC | PRN

## 2015-04-01 MED ORDER — SODIUM CHLORIDE 0.9 % IV BOLUS (SEPSIS)
1000.0000 mL | Freq: Once | INTRAVENOUS | Status: AC
  Administered 2015-04-01: 1000 mL via INTRAVENOUS

## 2015-04-01 MED ORDER — GI COCKTAIL ~~LOC~~
30.0000 mL | Freq: Once | ORAL | Status: AC
  Administered 2015-04-01: 30 mL via ORAL
  Filled 2015-04-01: qty 30

## 2015-04-01 MED ORDER — ASPIRIN 325 MG PO TABS
325.0000 mg | ORAL_TABLET | Freq: Once | ORAL | Status: AC
  Administered 2015-04-01: 325 mg via ORAL
  Filled 2015-04-01: qty 1

## 2015-04-01 MED ORDER — MORPHINE SULFATE (PF) 4 MG/ML IV SOLN
4.0000 mg | Freq: Once | INTRAVENOUS | Status: DC | PRN
Start: 2015-04-01 — End: 2015-04-02

## 2015-04-01 MED ORDER — MORPHINE SULFATE (PF) 4 MG/ML IV SOLN: 4.0000 mg | Freq: Once | INTRAVENOUS | Status: DC

## 2015-04-01 NOTE — ED Notes (Signed)
Pt. reports central chest pain with SOB worse with deep inspiration onset this evening / no nausea or diaphoresis , pt. also articulated suicidal ideation but did not disclose plan of suicide , denies hallucinations .

## 2015-04-01 NOTE — ED Provider Notes (Signed)
CSN: [id number]     Arrival date & time 04/01/15  2059 History   First MD Initiated Contact with Patient 04/01/15 2120     Chief Complaint  Patient presents with  . Chest Pain  . Suicidal     (Consider location/radiation/quality/duration/timing/severity/associated sxs/prior Treatment) HPI Comments: Henry Hicks is a 44 y.o. male with a PMHx of HTN, HLD, bipolar 1 disorder, and depression, who presents to the ED with complaints of chest pain that began approximately 1 hour prior to arrival. Patient states he has been stressed out recently, and is homeless, and he was walking outside when he had sudden onset 8/10 central nonradiating sharp chest pain which has been constant, worse with coughing movement or deep inspiration, with no treatments tried prior to arrival. Patient has associated shortness of breath which she states is "just a little bit", dry cough 1 week, and suicidal ideations with no plan. He also secondarily states he has had some constipation reporting he has not had a bowel movement in 3 days in the last bowel movement he had was hard, and he has had some occasional dysuria recently.  He denies any fevers, chills, URI symptoms, lightheadedness, diaphoresis, wheezing, leg swelling, recent travel/surgery/immobilization, family or personal history of DVT/PE, abdominal pain, nausea, vomiting, diarrhea, obstipation, rectal bleeding, hematuria, penile discharge, testicular pain or swelling, numbness, tingling, or weakness. No known family history of cardiac disease. He denies any HI or AVH. He admits to being a smoker, 1 pack per day. He admits to drinking ear occasionally, stating that his last beer was last night. He also admits to frequent cocaine use, last use was on Friday, stating he used approximately 1 g.  Patient is a 44 y.o. male presenting with chest pain. The history is provided by the patient. No language interpreter was used.  Chest Pain Pain location:  Substernal  area Pain quality: sharp   Pain radiates to:  Does not radiate Pain radiates to the back: no   Pain severity:  Moderate Onset quality:  Sudden Duration:  1 hour Timing:  Constant Progression:  Unchanged Chronicity:  New Context: stress   Relieved by:  None tried Worsened by:  Coughing, deep breathing and movement Ineffective treatments:  None tried Associated symptoms: cough   Associated symptoms: no abdominal pain, no claudication, no diaphoresis, no fever, no lower extremity edema, no nausea, no numbness, no orthopnea, no shortness of breath, not vomiting and no weakness   Risk factors: high cholesterol, hypertension, male sex and smoking   Risk factors: no coronary artery disease, no diabetes mellitus, no immobilization, no prior DVT/PE and no surgery     Past Medical History  Diagnosis Date  . Hypertension   . Hypercholesteremia   . Sleep apnea   . Bipolar 1 disorder (HCC)   . Depression    History reviewed. No pertinent past surgical history. No family history on file. Social History  Substance Use Topics  . Smoking status: Current Every Day Smoker    Types: Cigarettes  . Smokeless tobacco: None  . Alcohol Use: No    Review of Systems  Constitutional: Negative for fever, chills and diaphoresis.  HENT: Negative for ear discharge, rhinorrhea and sore throat.   Eyes: Negative for discharge.  Respiratory: Positive for cough. Negative for shortness of breath and wheezing.   Cardiovascular: Positive for chest pain. Negative for orthopnea, claudication and leg swelling.  Gastrointestinal: Positive for constipation (no BM in 3 days). Negative for nausea, vomiting, abdominal pain, diarrhea and anal  bleeding.  Genitourinary: Positive for dysuria (occasionally). Negative for hematuria, discharge, scrotal swelling, penile pain and testicular pain.  Musculoskeletal: Negative for myalgias and arthralgias.  Skin: Negative for color change.  Allergic/Immunologic: Negative for  immunocompromised state.  Neurological: Negative for weakness, light-headedness and numbness.  Psychiatric/Behavioral: Positive for suicidal ideas. Negative for hallucinations and confusion.   10 Systems reviewed and are negative for acute change except as noted in the HPI.    Allergies  Review of patient's allergies indicates no known allergies.  Home Medications   Prior to Admission medications   Medication Sig Start Date End Date Taking? Authorizing Provider  hydrOXYzine (ATARAX/VISTARIL) 25 MG tablet Take 1 tablet (25 mg total) by mouth every 6 (six) hours as needed for anxiety or nausea. 01/03/15   [name], MD   BP 131/88 mmHg  Pulse 81  Temp(Src) 98.7 F (37.1 C) (Oral)  Resp 20  SpO2 100% Physical Exam  Constitutional: He is oriented to person, place, and time. Vital signs are normal. He appears well-developed and well-nourished.  Non-toxic appearance. No distress.  Afebrile, nontoxic, NAD  HENT:  Head: Normocephalic and atraumatic.  Mouth/Throat: Oropharynx is clear and moist and mucous membranes are normal.  Eyes: Conjunctivae and EOM are normal. Right eye exhibits no discharge. Left eye exhibits no discharge.  Neck: Normal range of motion. Neck supple.  Cardiovascular: Normal rate, regular rhythm, normal heart sounds and intact distal pulses.  Exam reveals no gallop and no friction rub.   No murmur heard. RRR, nl s1/s2, no m/r/g, distal pulses intact, no pedal edema   Pulmonary/Chest: Effort normal and breath sounds normal. No respiratory distress. He has no decreased breath sounds. He has no wheezes. He has no rhonchi. He has no rales. He exhibits tenderness. He exhibits no crepitus, no deformity and no retraction.    CTAB in all lung fields, no w/r/r, no hypoxia or increased WOB, speaking in full sentences, SpO2 100% on RA  Chest wall mildly TTP along L sternal border, no crepitus or retraction, no deformities.  Abdominal: Soft. Normal appearance and bowel sounds  are normal. He exhibits no distension. There is no tenderness. There is no rigidity, no rebound, no guarding, no CVA tenderness, no tenderness at McBurney's point and negative Murphy's sign.  Soft, NTND, +BS throughout, no r/g/r, neg murphy's, neg mcburney's, no CVA TTP   Musculoskeletal: Normal range of motion.  MAE x4 Strength and sensation grossly intact Distal pulses intact No pedal edema, neg homan's bilaterally  Gait steady  Neurological: He is alert and oriented to person, place, and time. He has normal strength. No sensory deficit.  Skin: Skin is warm, dry and intact. No rash noted.  Psychiatric: He is not actively hallucinating. He exhibits a depressed mood. He expresses suicidal ideation. He expresses no homicidal ideation. He expresses no suicidal plans and no homicidal plans.  Depressed/flat affect, endorsing SI without a plan, no HI/AVH  Nursing note and vitals reviewed.   ED Course  Procedures (including critical care time) Labs Review Labs Reviewed  COMPREHENSIVE METABOLIC PANEL - Abnormal; Notable for the following:    Potassium 3.4 (*)    Glucose, Bld 102 (*)    ALT 13 (*)    All other components within normal limits  ACETAMINOPHEN LEVEL - Abnormal; Notable for the following:    Acetaminophen (Tylenol), Serum <10 (*)    All other components within normal limits  URINE RAPID DRUG SCREEN, HOSP PERFORMED - Abnormal; Notable for the following:  Cocaine POSITIVE (*)    All other components within normal limits  URINALYSIS, ROUTINE W REFLEX MICROSCOPIC (NOT AT [facility]) - Abnormal; Notable for the following:    Bilirubin Urine SMALL (*)    All other components within normal limits  ETHANOL  SALICYLATE LEVEL  CBC  RPR  HIV ANTIBODY (ROUTINE TESTING)  I-STAT TROPOININ, ED  I-STAT TROPOININ, ED  GC/CHLAMYDIA PROBE AMP ([place]) NOT AT [facility]    Imaging Review Dg Chest 2 View  04/01/2015  CLINICAL DATA:  Chest pain, posterior neck pain. EXAM: CHEST  2 VIEW  COMPARISON:  10/08/2014 FINDINGS: The cardiomediastinal contours are normal. The lungs are clear. Pulmonary vasculature is normal. No consolidation, pleural effusion, or pneumothorax. Mild broad-based S-shaped scoliotic curvature of the spine, unchanged. No acute osseous abnormalities are seen. IMPRESSION: No acute pulmonary process.  Mild scoliosis. Electronically Signed   By: Rubye Oaks M.D.   On: 04/01/2015 21:49   I have personally reviewed and evaluated these images and lab results as part of my medical decision-making.   EKG Interpretation   Date/Time:  Tuesday April 01 2015 21:04:58 EST Ventricular Rate:  81 PR Interval:  128 QRS Duration: 90 QT Interval:  360 QTC Calculation: 418 R Axis:   67 Text Interpretation:  Normal sinus rhythm ST \\T \ T wave abnormality,  consider inferolateral ischemia Abnormal ECG Confirmed by Adriana Simas  MD, BRIAN  [phone]) on 04/01/2015 10:26:48 PM      MDM   Final diagnoses:  Chest pain, unspecified chest pain type  Abnormal EKG  Suicidal ideations  Cough  Constipation, unspecified constipation type  Dysuria  Homelessness  Tobacco use  Cocaine use    44 y.o. male here with multiple complaints, including CP, "a little bit" of SOB, cough, mild constipation with no BM in 3 days, and some dysuria occasionally. Also has SI with no plan. Admits to cocaine use on Friday. 1 beer last night. On exam, reproducible chest tenderness, no abd tenderness and bowel sounds throughout therefore doubt obstruction. No hypoxia or tachycardia, PERC negative therefore doubt need for dimer testing. Will get labs, CXR, EKG, UDS, U/A, and psych clearance labs. Will give GI cocktail, ASA, morphine, and NTG. Will give fluids. Will also obtain STD testing given homelessness and IVDU. Will first medically clear pt then consult TTS. Will reassess shortly.   10:44 PM EKG with some inferolateral T wave inversions, no prior to compare. Trop neg, will likely need 2nd troponin  at 12:45am in order to fully evaluate cardiac etiology. UDS with +cocaine which could also be contributing. CMP with mildly low K at 3.4, doubt need for intervention. EtOH, salicylate, acetaminophen, and CBC all WNL. CXR clear. Awaiting U/A. Will reassess shortly.   11:27 PM U/A neg, doubt UTI, doubt GC/CT, dysuria is likely from dehydration given that pt is homeless. Will proceed with TTS consultation, but pt not yet medically cleared until after his second trop. Pain is improved after ASA and GI cocktail. Pt requesting food stating that it "will help his pain". Will order meal.  12:48 AM Care signed out to [facility] PA-C at shift change. Awaiting TTS recommendations and second troponin. As long as second trop is neg, then he is medically cleared. Please see her notes for further documentation of care.  BP 123/62 mmHg  Pulse 84  Temp(Src) 98.7 F (37.1 C) (Oral)  Resp 16  SpO2 97%  Meds ordered this encounter  Medications  . aspirin tablet 325 mg    Sig:   .  gi cocktail (Maalox,Lidocaine,Donnatal)    Sig:   . DISCONTD: morphine 4 MG/ML injection 4 mg    Sig:   . nitroGLYCERIN (NITROSTAT) SL tablet 0.4 mg    Sig:   . sodium chloride 0.9 % bolus 1,000 mL    Sig:   . morphine 4 MG/ML injection 4 mg    Sig:      Jahlisa Rossitto Camprubi-Soms, PA-C 04/02/15 0049  Donnetta Hutching, MD 04/04/15 [phone]

## 2015-04-01 NOTE — ED Notes (Signed)
Staffing office notified for pt.'s sitter , purple scrubs given to pt. at triage , security paged to wand pt.

## 2015-04-01 NOTE — ED Notes (Signed)
TTS at bedside. 

## 2015-04-02 ENCOUNTER — Encounter (HOSPITAL_COMMUNITY): Payer: Self-pay | Admitting: *Deleted

## 2015-04-02 ENCOUNTER — Observation Stay (HOSPITAL_COMMUNITY)
Admission: AD | Admit: 2015-04-02 | Discharge: 2015-04-03 | Disposition: A | Discharge status: Home / Self Care | Payer: Federal, State, Local not specified - Other | Source: Intra-hospital | Attending: Psychiatry | Admitting: Psychiatry

## 2015-04-02 DIAGNOSIS — F419 Anxiety disorder, unspecified: Secondary | ICD-10-CM | POA: Insufficient documentation

## 2015-04-02 DIAGNOSIS — F17210 Nicotine dependence, cigarettes, uncomplicated: Secondary | ICD-10-CM | POA: Insufficient documentation

## 2015-04-02 DIAGNOSIS — G4730 Sleep apnea, unspecified: Secondary | ICD-10-CM | POA: Insufficient documentation

## 2015-04-02 DIAGNOSIS — E7800 Pure hypercholesterolemia, unspecified: Secondary | ICD-10-CM | POA: Insufficient documentation

## 2015-04-02 DIAGNOSIS — F339 Major depressive disorder, recurrent, unspecified: Principal | ICD-10-CM | POA: Diagnosis present

## 2015-04-02 DIAGNOSIS — Z590 Homelessness: Secondary | ICD-10-CM | POA: Insufficient documentation

## 2015-04-02 DIAGNOSIS — I10 Essential (primary) hypertension: Secondary | ICD-10-CM | POA: Insufficient documentation

## 2015-04-02 LAB — RPR: RPR: NONREACTIVE

## 2015-04-02 LAB — I-STAT TROPONIN, ED
TROPONIN I, POC: 0 ng/mL (ref 0.00–0.08)
Troponin i, poc: 0.01 ng/mL (ref 0.00–0.08)

## 2015-04-02 LAB — HIV ANTIBODY (ROUTINE TESTING W REFLEX): HIV SCREEN 4TH GENERATION: NONREACTIVE

## 2015-04-02 LAB — GC/CHLAMYDIA PROBE AMP (~~LOC~~) NOT AT ARMC
Chlamydia: NEGATIVE
Neisseria Gonorrhea: NEGATIVE

## 2015-04-02 MED ORDER — GI COCKTAIL ~~LOC~~
30.0000 mL | Freq: Once | ORAL | Status: AC
Start: 2015-04-02 — End: 2015-04-02
  Administered 2015-04-02: 30 mL via ORAL

## 2015-04-02 MED ORDER — ACETAMINOPHEN 325 MG PO TABS: 650.0000 mg | ORAL_TABLET | Freq: Four times a day (QID) | ORAL | Status: DC | PRN

## 2015-04-02 MED ORDER — GI COCKTAIL ~~LOC~~
ORAL | Status: AC
  Filled 2015-04-02: qty 30

## 2015-04-02 MED ORDER — MAGNESIUM HYDROXIDE 400 MG/5ML PO SUSP: 30.0000 mL | Freq: Every day | ORAL | Status: DC | PRN

## 2015-04-02 MED ORDER — ALUM & MAG HYDROXIDE-SIMETH 200-200-20 MG/5ML PO SUSP: 30.0000 mL | ORAL | Status: DC | PRN

## 2015-04-02 MED ORDER — IBUPROFEN 400 MG PO TABS: 600.0000 mg | ORAL_TABLET | Freq: Three times a day (TID) | ORAL | Status: DC | PRN

## 2015-04-02 MED ORDER — ONDANSETRON HCL 4 MG PO TABS: 4.0000 mg | ORAL_TABLET | Freq: Three times a day (TID) | ORAL | Status: DC | PRN

## 2015-04-02 MED ORDER — ACETAMINOPHEN 325 MG PO TABS: 650.0000 mg | ORAL_TABLET | ORAL | Status: DC | PRN

## 2015-04-02 MED ORDER — METHOCARBAMOL 750 MG PO TABS
750.0000 mg | ORAL_TABLET | Freq: Four times a day (QID) | ORAL | Status: DC | PRN
  Administered 2015-04-02 – 2015-04-03 (×2): 750 mg via ORAL
  Filled 2015-04-02 (×2): qty 1

## 2015-04-02 MED ORDER — PANTOPRAZOLE SODIUM 40 MG PO TBEC
40.0000 mg | DELAYED_RELEASE_TABLET | Freq: Every day | ORAL | Status: DC
  Administered 2015-04-02 – 2015-04-03 (×2): 40 mg via ORAL
  Filled 2015-04-02 (×2): qty 1

## 2015-04-02 MED ORDER — FAMOTIDINE 20 MG PO TABS
20.0000 mg | ORAL_TABLET | Freq: Two times a day (BID) | ORAL | Status: DC
  Administered 2015-04-02: 20 mg via ORAL
  Filled 2015-04-02: qty 1

## 2015-04-02 MED ORDER — TRAZODONE HCL 50 MG PO TABS
50.0000 mg | ORAL_TABLET | Freq: Every evening | ORAL | Status: DC | PRN
  Administered 2015-04-02: 25 mg via ORAL
  Filled 2015-04-02: qty 1

## 2015-04-02 NOTE — ED Notes (Signed)
Called main lab to have urine gc/chlamydia added on. Requisition sent.

## 2015-04-02 NOTE — ED Notes (Signed)
With TTS 

## 2015-04-02 NOTE — ED Provider Notes (Signed)
10:13 AM patient is unhappy about having his phone removed per policy. Explained patient why this occurs and that after he is released to have his phone back. After long discussion patient does want to still go to behavioral health and is going voluntarily.  Pricilla LovelessScott Aubree Doody, MD 04/02/15 [phone]
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
## 2015-04-02 NOTE — BH Assessment (Signed)
BHH Assessment Progress Note Patient was seen by Dominga FerryAgustin NP and Umberger LCAS. Patient has an extensive history of cocaine use and is actively detox[facility]. Patient is open to outpatient interventions and will be referred to [facility] Drug [facility] on discharge. Patient will be continued to be monitored in the Observational unit and re-evaluated on 04/03/15.

## 2015-04-02 NOTE — ED Notes (Signed)
[facility] called for transportation 

## 2015-04-02 NOTE — ED Provider Notes (Signed)
9:34 AM [facility] Accepting is Dr. Alvia GroveKumar  Leahann Lempke, MD 04/02/15 [phone]
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
## 2015-04-02 NOTE — ED Provider Notes (Signed)
12:49 AM Patient signed out to me by Kellogg, PA-C. Patient pending delta troponin for medical clearance. Vitals stable and patient afebrile.   1:46 AM Patient's delta troponin negative. Patient will be moved to Pod C for further evaluation. Patient is medically cleared.   Results for orders placed or performed during the hospital encounter of 04/01/15  Comprehensive metabolic panel  Result Value Ref Range   Sodium 141 135 - 145 mmol/L   Potassium 3.4 (L) 3.5 - 5.1 mmol/L   Chloride 106 101 - 111 mmol/L   CO2 28 22 - 32 mmol/L   Glucose, Bld 102 (H) 65 - 99 mg/dL   BUN 10 6 - 20 mg/dL   Creatinine, Ser 1.61 0.61 - 1.24 mg/dL   Calcium 9.2 8.9 - 09.6 mg/dL   Total Protein 6.6 6.5 - 8.1 g/dL   Albumin 3.9 3.5 - 5.0 g/dL   AST 17 15 - 41 U/L   ALT 13 (L) 17 - 63 U/L   Alkaline Phosphatase 64 38 - 126 U/L   Total Bilirubin 0.4 0.3 - 1.2 mg/dL   GFR calc non Af Amer >60 >60 mL/min   GFR calc Af Amer >60 >60 mL/min   Anion gap 7 5 - 15  Ethanol (ETOH)  Result Value Ref Range   Alcohol, Ethyl (B) <5 <5 mg/dL  Salicylate level  Result Value Ref Range   Salicylate Lvl <4.0 2.8 - 30.0 mg/dL  Acetaminophen level  Result Value Ref Range   Acetaminophen (Tylenol), Serum <10 (L) 10 - 30 ug/mL  CBC  Result Value Ref Range   WBC 8.1 4.0 - 10.5 K/uL   RBC 4.85 4.22 - 5.81 MIL/uL   Hemoglobin 13.3 13.0 - 17.0 g/dL   HCT 04.5 40.9 - 81.1 %   MCV 85.2 78.0 - 100.0 fL   MCH 27.4 26.0 - 34.0 pg   MCHC 32.2 30.0 - 36.0 g/dL   RDW 91.4 78.2 - 95.6 %   Platelets 254 150 - 400 K/uL  Urine rapid drug screen (hosp performed) (Not at [facility])  Result Value Ref Range   Opiates NONE DETECTED NONE DETECTED   Cocaine POSITIVE (A) NONE DETECTED   Benzodiazepines NONE DETECTED NONE DETECTED   Amphetamines NONE DETECTED NONE DETECTED   Tetrahydrocannabinol NONE DETECTED NONE DETECTED   Barbiturates NONE DETECTED NONE DETECTED  Urinalysis, Routine w reflex microscopic (not at [facility])  Result  Value Ref Range   Color, Urine YELLOW YELLOW   APPearance CLEAR CLEAR   Specific Gravity, Urine 1.028 1.005 - 1.030   pH 6.0 5.0 - 8.0   Glucose, UA NEGATIVE NEGATIVE mg/dL   Hgb urine dipstick NEGATIVE NEGATIVE   Bilirubin Urine SMALL (A) NEGATIVE   Ketones, ur NEGATIVE NEGATIVE mg/dL   Protein, ur NEGATIVE NEGATIVE mg/dL   Nitrite NEGATIVE NEGATIVE   Leukocytes, UA NEGATIVE NEGATIVE  I-Stat Troponin, ED (not at [facility])  Result Value Ref Range   Troponin i, poc 0.00 0.00 - 0.08 ng/mL   Comment 3          I-stat troponin, ED  Result Value Ref Range   Troponin i, poc 0.01 0.00 - 0.08 ng/mL   Comment 3           Dg Chest 2 View  04/01/2015  CLINICAL DATA:  Chest pain, posterior neck pain. EXAM: CHEST  2 VIEW COMPARISON:  10/08/2014 FINDINGS: The cardiomediastinal contours are normal. The lungs are clear. Pulmonary vasculature is normal. No consolidation, pleural effusion, or  pneumothorax. Mild broad-based S-shaped scoliotic curvature of the spine, unchanged. No acute osseous abnormalities are seen. IMPRESSION: No acute pulmonary process.  Mild scoliosis. Electronically Signed   By: Rubye OaksMelanie  Ehinger M.D.   On: 04/01/2015 21:49      Emilia BeckKaitlyn Cleveland Yarbro, PA-C 04/02/15 0147  Dione Boozeavid Glick, MD 04/02/15 [phone]

## 2015-04-02 NOTE — Progress Notes (Signed)
Discussed pt's case with psych team. Dr. Lucianne [facility]ussKumar [facility]. Report can be called at [phone], pt can arrive anytime, and admission is voluntary.  Spoke with [facility] re: [facility]  Ilean SkillMeghan Intisar Claudio, MSW, LCSW Clinical Social Work, Disposition  04/02/2015 [phone]
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
## 2015-04-02 NOTE — ED Provider Notes (Signed)
Pt still complains of heartburn.  Here with cocaine abuse and depression holding for psych eval.  Will add on one more troponin considering the cocaine use.  Ordered Pepcid BID  Linwood DibblesJon Rosetta Rupnow, MD 04/02/15 [phone]

## 2015-04-02 NOTE — Progress Notes (Signed)
Admission Note: Patient admitted to observation after using cocaine and stating that he was suicidal. Patient states he wants help. Reports deep sadness, fatigue, excessive guilt, decreased self esteem, tearfulness, lack of motivation for activities, irritability and sleep disturbances. Pt sts he has no current legal issues.   Pt sts that he is currently homeless and has been homeless for about 3-4 months since he was laid off from his [employer] job. Pt sts that he hates himself because he cannot support his family including a 663 month old [place]aby that he particularly cares about. Patient oriented to unit. Vitals stable.

## 2015-04-02 NOTE — Plan of Care (Signed)
[facility] Observation Crisis Plan  Reason for Crisis Plan:  Substance Abuse    Plan of Care:  Referral for Inpatient Hospitalization and Referral for Substance Abuse  Family Support:      Current Living Environment:  Living Arrangements: Alone  Insurance:   Hospital Account    Name Acct ID Class Status Primary Coverage   Henry Hicks, Henry Hicks [id number] BEHAVIORAL HEALTH OBSERVATION [facility] [place] [address]        Guarantor Account (for Hospital Account [id number])    Name Relation to Pt Service Area Active? Acct Type   Henry Hicks, Henry Hicks Self [facility] Yes Behavioral Health   Address Phone       [address] [place], [place] [address] [phone])          Coverage Information (for Hospital Account [id number])    F/O Payor/Plan Precert #   [facility] [address]    Subscriber Subscriber #   Henry Hicks, Henry Hicks [id number]   Address Phone   [address] [place], [place] [address] [phone]      Legal Guardian:     Primary Care Provider:  No primary care provider on file.  Current Outpatient Providers:  unknown  Psychiatrist:     Counselor/Therapist:     Compliant with Medications:  No  Additional Information:   Henry Hicks, Henry Hicks 11/16/201611:26 AM

## 2015-04-02 NOTE — ED Notes (Signed)
Patient arrived to room with sitter and wearing scrubs. Sandwich provided.

## 2015-04-02 NOTE — ED Notes (Signed)
Spoke with Dr. Ranae PalmsYelverton about pt complaint of heartburn. Dr acknowledged and gave verbal order for GI cocktail.

## 2015-04-02 NOTE — Progress Notes (Signed)
[facility] INPATIENT:  Family/Significant Other Suicide Prevention Education  Suicide Prevention Education:  Patient Refusal for Family/Significant Other Suicide Prevention Education: The patient Henry Hicks has refused to provide written consent for family/significant other to be provided Family/Significant Other Suicide Prevention Education during admission and/or prior to discharge.  Physician notified.  Henry Hicks, Henry Hicks 04/02/2015, 11:26 AM
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
## 2015-04-03 DIAGNOSIS — F339 Major depressive disorder, recurrent, unspecified: Secondary | ICD-10-CM | POA: Diagnosis not present

## 2015-04-03 MED ORDER — TRAZODONE HCL 50 MG PO TABS: 50.0000 mg | ORAL_TABLET | Freq: Every evening | ORAL | Status: DC | PRN

## 2015-04-03 MED ORDER — MIRTAZAPINE 7.5 MG PO TABS: 7.5000 mg | ORAL_TABLET | Freq: Every day | ORAL | Status: DC

## 2015-04-03 MED ORDER — NICOTINE 21 MG/24HR TD PT24
21.0000 mg | MEDICATED_PATCH | Freq: Every day | TRANSDERMAL | Status: DC
  Administered 2015-04-03: 21 mg via TRANSDERMAL
  Filled 2015-04-03: qty 1

## 2015-04-03 MED ORDER — HYDROXYZINE HCL 50 MG PO TABS: 50.0000 mg | ORAL_TABLET | Freq: Three times a day (TID) | ORAL | Status: DC | PRN

## 2015-04-03 MED ORDER — HYDROXYZINE HCL 50 MG PO TABS
50.0000 mg | ORAL_TABLET | Freq: Three times a day (TID) | ORAL | Status: DC | PRN
  Administered 2015-04-03: 50 mg via ORAL
  Filled 2015-04-03: qty 1

## 2015-04-03 MED ORDER — METHOCARBAMOL 750 MG PO TABS: 750.0000 mg | ORAL_TABLET | Freq: Four times a day (QID) | ORAL | Status: DC | PRN

## 2015-04-03 NOTE — Progress Notes (Signed)
Nursing Shift Assessment:  Patient reluctant to answer Nurse's questions initially but finally did state that he is not currently having any SI/HI or AVH but does state he is having severe depression. Patient has considerably irritable and demanding disposition. Patient does verbally contract for safety. Q15 minute checks for safety continuous. Therapeutic communication and emotional support provided. Patient remains safe on Unit.  Patient remains safe on Unit. Has spoken to two Providers two times with additional requests, some plausible but others not.

## 2015-04-03 NOTE — Progress Notes (Signed)
Nursing Discharge Assessment:  Patient has had all belongings returned to him and has signed for them. Patient also given copy of Discharge Packet and signing copy to be placed with the chart. Patient is denying any SI/HI or AVH and states he understands his followup instructions and where and when he is to go. [facility].

## 2015-04-03 NOTE — Discharge Summary (Signed)
OBS Discharge Summary Note  Patient:  Nels Munn is an 44 y.o., male MRN:  [id number] DOB:  20-Apr-1971 Patient phone:  [phone] (home)  Patient address:   [address] [place] [address],  Total Time spent with patient: 45 minutes  Date of Admission:  04/02/2015 Date of Discharge: 04/03/2015  Reason for Admission:  depression  Principal Problem: Major depressive disorder, recurrent, unspecified [facility]) Discharge Diagnoses: Patient Active Problem List   Diagnosis Date Noted  . Major depressive disorder, recurrent, unspecified (HCC) [F33.9] 04/02/2015    Priority: High    Past Psychiatric History: see above  Past Medical History:  Past Medical History  Diagnosis Date  . Hypertension   . Hypercholesteremia   . Sleep apnea   . Bipolar 1 disorder (HCC)   . Depression    History reviewed. No pertinent past surgical history. Family History: History reviewed. No pertinent family history. Family Psychiatric  History: Denies Social History:  History  Alcohol Use No     History  Drug Use  . Yes  . Special: Cocaine    Social History   Social History  . Marital Status: Single    Spouse Name: N/A  . Number of Children: N/A  . Years of Education: N/A   Social History Main Topics  . Smoking status: Current Every Day Smoker    Types: Cigarettes  . Smokeless tobacco: None  . Alcohol Use: No  . Drug Use: Yes    Special: Cocaine  . Sexual Activity: Not Asked   Other Topics Concern  . None   Social History Narrative    Hospital Course:  Muneeb Veras was admitted to OBS unit for Major depressive disorder, recurrent, unspecified (HCC) and crisis management.  He was treated discharged with the medications listed below under Medication List.  Medical problems were identified and treated as needed.  Home medications were restarted as appropriate.  Improvement and safety was monitored by observation and Teodoro Kil.         He was evaluated by the  treatment team for stability and plans for continued recovery upon discharge.  Jumar Greenstreet motivation was an integral factor for scheduling further treatment.  Employment, transportation, bed availability, health status, family support, and any pending legal issues were also considered during his hospital stay.  He was offered further treatment options upon discharge including but not limited to Residential, Intensive Outpatient, and Outpatient treatment.  Dmari Schubring will follow up with the services as listed below under Follow Up Information.     Upon completion of this admission the patient was both mentally and medically stable for discharge denying suicidal/homicidal ideation, auditory/visual/tactile hallucinations, delusional thoughts and paranoia.      Physical Findings: AIMS: Facial and Oral Movements Muscles of Facial Expression: None, normal Lips and Perioral Area: None, normal Jaw: None, normal Tongue: None, normal,Extremity Movements Upper (arms, wrists, hands, fingers): None, normal Lower (legs, knees, ankles, toes): None, normal, Trunk Movements Neck, shoulders, hips: None, normal, Overall Severity Severity of abnormal movements (highest score from questions above): None, normal Incapacitation due to abnormal movements: None, normal Patient's awareness of abnormal movements (rate only patient's report): No Awareness, Dental Status Current problems with teeth and/or dentures?: No Does patient usually wear dentures?: No  CIWA:    COWS:  COWS Total Score: 4  Musculoskeletal: Strength & Muscle Tone: within normal limits Gait & Station: normal Patient leans: N/A  Psychiatric Specialty Exam: Review of Systems  All other systems reviewed and are negative.  Blood pressure 130/81, pulse 70, temperature 98.2 F (36.8 C), temperature source Oral, resp. rate 16, height 5\' 8"  (1.727 m), weight 86.183 kg (190 lb).Body mass index is 28.9 kg/(m^2).   General Appearance: Neat   Eye Contact:: Good  Speech: Pressured  Volume: Normal  Mood: Anxious and Euthymic  Affect: Appropriate  Thought Process: Circumstantial  Orientation: Full (Time, Place, and Person)  Thought Content: Rumination  Suicidal Thoughts: No  Homicidal Thoughts: No  Memory: Immediate; Good Recent; Good Remote; Good  Judgement: Fair  Insight: Fair  Psychomotor Activity: Normal  Concentration: Good  Recall: Good  Fund of Knowledge:Good  Language: Good  Akathisia: Negative  Handed: Right  AIMS (if indicated):    Assets: Desire for Improvement Resilience Social Support  ADL's: Intact  Cognition: WNL  Sleep: poor       Have you used any form of tobacco in the last 30 days? (Cigarettes, Smokeless Tobacco, Cigars, and/or Pipes): Yes  Has this patient used any form of tobacco in the last 30 days? (Cigarettes, Smokeless Tobacco, Cigars, and/or Pipes) Yes refused N/A  Metabolic Disorder Labs:  No results found for: HGBA1C, MPG No results found for: PROLACTIN No results found for: CHOL, TRIG, HDL, CHOLHDL, VLDL, LDLCALC  Discharge destination:  Home  Is patient on multiple antipsychotic therapies at discharge:  No   Has Patient had three or more failed trials of antipsychotic monotherapy by history:  No  Recommended Plan for Multiple Antipsychotic Therapies: NA    Medication List    STOP taking these medications        ranitidine 150 MG tablet  Commonly known as:  ZANTAC      TAKE these medications      Indication   hydrOXYzine 50 MG tablet  Commonly known as:  ATARAX/VISTARIL  Take 1 tablet (50 mg total) by mouth 3 (three) times daily as needed for anxiety (order is for x 1 NOW).   Indication:  Anxiety Neurosis     methocarbamol 750 MG tablet  Commonly known as:  ROBAXIN  Take 1 tablet (750 mg total) by mouth every 6 (six) hours as needed for muscle spasms.   Indication:  Musculoskeletal Pain     mirtazapine 7.5  MG tablet  Commonly known as:  REMERON  Take 1 tablet (7.5 mg total) by mouth at bedtime.   Indication:  Trouble Sleeping, Major Depressive Disorder           Follow-up Information    [facility]   Specialty:  Behavioral Health   Why:  TO BEGIN MEDICATION MANAGEMENT. REPORT ANYTIME M-F BY 9AM   Contact information:   [address] [place] [address] [phone]       Follow-up recommendations:  Activity:  as tol Diet:  as tol  Comments:  1.  Take all your medications as prescribed.              2.  Report any adverse side effects to outpatient provider.  Patient given outpatient resources to [facility] and the [facility] program.                       3.  Patient instructed to not use alcohol or illegal drugs while on prescription medicines.            4.  In the event of worsening symptoms, instructed patient to call 911, the crisis hotline or go to nearest emergency room for evaluation of symptoms.  Signed: Velna HatchetSheila  May Romayne Ticas AGNP-BC 04/03/2015, 3:18 PM

## 2015-04-03 NOTE — H&P (Signed)
OBS Admission Assessment Adult  Patient Identification: Henry Hicks MRN:  [id number] Date of Evaluation:  04/03/2015 Chief Complaint:  Unspecified Depressive Disorder Principal Diagnosis: Major depressive disorder, recurrent, unspecified ([place]) Diagnosis:   Patient Active Problem List   Diagnosis Date Noted  . Major depressive disorder, recurrent, unspecified ([place]) [F33.9] 04/02/2015    Priority: High   History of Present Illness: Henry Hicks is an 44 y.o.single male who came into the [facility] seeking help for SI.  He states that it has worsened.  Pt sts that he has been having SI since late last week. I was reported that Pt stated that he would "rather be dead because I'm letting my family down."  He states that he could use a gun and that he has attempted to take his own life twice in the past.  He denied HI, SI and VH.  However he heard voices telling him he was worthless and he should kill himself.  Pt sts his biggest stressors currently are homelessness, financial problems and no income/no way to support himself.  Per TTS report, Pt binges on crack cocaine "every few weeks" using "as much as I can get." Pt sts that he drinks alcohol but, usually only 1 beer every few months. Pt sts that hs has used marijuana in the past but quit 25 years ago.  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 Pt sts that he hates himself because he cannot support his family including a 37 month old [place] that he particularly cares about. Pt sts that he has not been on any MH medications in about 1 year and has not had a psychiatrist since he moved to [place] from [place] about 6-7 months ago. Pt sts that he was receiving services through [facility] but not now. Pt sts he has not seen an OPT since moving to [place]. Pt sts that he did see an OPT in [place] for about 6 months up until he  moved to [facility].   Patient was seen today and the above information was verified.  He is irritable and wants to go and have a smoke.  He states that life seemed to go downhill for him.  He has little family support esp since he lives in [place] now.    Associated Signs/Symptoms: Depression Symptoms:  depressed mood, hopelessness, anxiety, (Hypo) Manic Symptoms:  Labiality of Mood,  irritable Anxiety Symptoms:  Excessive Worry, Psychotic Symptoms:  NA PTSD Symptoms: NA Total Time spent with patient: 45 minutes  Past Psychiatric History: MDD, substance abuse  Risk to Self: Is patient at risk for suicide?: Yes Risk to Others:   Prior Inpatient Therapy:   Prior Outpatient Therapy:    Alcohol Screening: 1. How often do you have a drink containing alcohol?: Monthly or less 2. How many drinks containing alcohol do you have on a typical day when you are drinking?: 1 or 2 3. How often do you have six or more drinks on one occasion?: Never Preliminary Score: 0 9. Have you or someone else been injured as a result of your drinking?: No 10. Has a relative or friend or a doctor or another health worker been concerned about your drinking or suggested you cut down?: No Alcohol Use Disorder Identification Test Final Score (AUDIT): 1 Brief Intervention: AUDIT score less than 7 or less-screening does not suggest unhealthy drinking-brief  intervention not indicated Substance Abuse History in the last 12 months:  No. Consequences of Substance Abuse: NA Previous Psychotropic Medications: Yes  Psychological Evaluations: Yes  Past Medical History:  Past Medical History  Diagnosis Date  . Hypertension   . Hypercholesteremia   . Sleep apnea   . Bipolar 1 disorder ([place])   . Depression    History reviewed. No pertinent past surgical history. Family History: History reviewed. No pertinent family history. Family Psychiatric  History: Denies Social History:  History  Alcohol Use No     History  Drug Use   . Yes  . Special: Cocaine    Social History   Social History  . Marital Status: Single    Spouse Name: N/A  . Number of Children: N/A  . Years of Education: N/A   Social History Main Topics  . Smoking status: Current Every Day Smoker    Types: Cigarettes  . Smokeless tobacco: None  . Alcohol Use: No  . Drug Use: Yes    Special: Cocaine  . Sexual Activity: Not Asked   Other Topics Concern  . 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, Personal relationships, Work / School Withdrawal Symptoms: Agitation Name of Substance 1: Alcohol 1 - Age of First Use: 10 1 - Amount (size/oz): 1 beer 1 - Frequency: "every few (3-4) months" 1 - Duration: ongoing 1 - Last Use / Amount: last pm Name of Substance 2: Crack Cocaine 2 - Age of First Use: 20s 2 - Amount (size/oz): "as much as I can get" 2 - Frequency: "every few weeks" 2 - Duration: Ongoing 2 - Last Use / Amount: last Thursday or Friday Name of Substance 3: Marijuana 3 - Last Use / Amount: 25 yrs ago    Allergies:  No Known Allergies Lab Results:  Results for orders placed or performed during the hospital encounter of 04/01/15 (from the past 48 hour(s))  Comprehensive metabolic panel     Status: Abnormal   Collection Time: 04/01/15  9:14 PM  Result Value Ref Range   Sodium 141 135 - 145 mmol/L   Potassium 3.4 (L) 3.5 - 5.1 mmol/L   Chloride 106 101 - 111 mmol/L   CO2 28 22 - 32 mmol/L   Glucose, Bld 102 (H) 65 - 99 mg/dL   BUN 10 6 - 20 mg/dL   Creatinine, Ser 1.22 0.61 - 1.24 mg/dL   Calcium 9.2 8.9 - 10.3 mg/dL   Total Protein 6.6 6.5 - 8.1 g/dL   Albumin 3.9 3.5 - 5.0 g/dL   AST 17 15 - 41 U/L   ALT 13 (L) 17 - 63 U/L   Alkaline Phosphatase 64 38 - 126 U/L   Total Bilirubin 0.4 0.3 - 1.2 mg/dL   GFR calc non Af Amer >60 >60 mL/min   GFR calc Af  Amer >60 >60 mL/min    Comment: (NOTE) The eGFR has been calculated using the CKD EPI equation. This calculation has not been validated in all clinical situations. eGFR's persistently <60 mL/min signify possible Chronic Kidney Disease.    Anion gap 7 5 - 15  Ethanol (ETOH)     Status: None   Collection Time: 04/01/15  9:14 PM  Result Value Ref Range   Alcohol, Ethyl (B) <5 <5 mg/dL    Comment:  LOWEST DETECTABLE LIMIT FOR SERUM ALCOHOL IS 5 mg/dL FOR MEDICAL PURPOSES ONLY   Salicylate level     Status: None   Collection Time: 04/01/15  9:14 PM  Result Value Ref Range   Salicylate Lvl <9.0 2.8 - 30.0 mg/dL  Acetaminophen level     Status: Abnormal   Collection Time: 04/01/15  9:14 PM  Result Value Ref Range   Acetaminophen (Tylenol), Serum <10 (L) 10 - 30 ug/mL    Comment:        THERAPEUTIC CONCENTRATIONS VARY SIGNIFICANTLY. A RANGE OF 10-30 ug/mL MAY BE AN EFFECTIVE CONCENTRATION FOR MANY PATIENTS. HOWEVER, SOME ARE BEST TREATED AT CONCENTRATIONS OUTSIDE THIS RANGE. ACETAMINOPHEN CONCENTRATIONS >150 ug/mL AT 4 HOURS AFTER INGESTION AND >50 ug/mL AT 12 HOURS AFTER INGESTION ARE OFTEN ASSOCIATED WITH TOXIC REACTIONS.   CBC     Status: None   Collection Time: 04/01/15  9:14 PM  Result Value Ref Range   WBC 8.1 4.0 - 10.5 K/uL   RBC 4.85 4.22 - 5.81 MIL/uL   Hemoglobin 13.3 13.0 - 17.0 g/dL   HCT 41.3 39.0 - 52.0 %   MCV 85.2 78.0 - 100.0 fL   MCH 27.4 26.0 - 34.0 pg   MCHC 32.2 30.0 - 36.0 g/dL   RDW 13.9 11.5 - 15.5 %   Platelets 254 150 - 400 K/uL  Urine rapid drug screen (hosp performed) (Not at [facility])     Status: Abnormal   Collection Time: 04/01/15  9:28 PM  Result Value Ref Range   Opiates NONE DETECTED NONE DETECTED   Cocaine POSITIVE (A) NONE DETECTED   Benzodiazepines NONE DETECTED NONE DETECTED   Amphetamines NONE DETECTED NONE DETECTED   Tetrahydrocannabinol NONE DETECTED NONE DETECTED   Barbiturates NONE DETECTED NONE DETECTED    Comment:         DRUG SCREEN FOR MEDICAL PURPOSES ONLY.  IF CONFIRMATION IS NEEDED FOR ANY PURPOSE, NOTIFY LAB WITHIN 5 DAYS.        LOWEST DETECTABLE LIMITS FOR URINE DRUG SCREEN Drug Class       Cutoff (ng/mL) Amphetamine      1000 Barbiturate      200 Benzodiazepine   240 Tricyclics       973 Opiates          300 Cocaine          300 THC              50   Urinalysis, Routine w reflex microscopic (not at [facility])     Status: Abnormal   Collection Time: 04/01/15  9:28 PM  Result Value Ref Range   Color, Urine YELLOW YELLOW   APPearance CLEAR CLEAR   Specific Gravity, Urine 1.028 1.005 - 1.030   pH 6.0 5.0 - 8.0   Glucose, UA NEGATIVE NEGATIVE mg/dL   Hgb urine dipstick NEGATIVE NEGATIVE   Bilirubin Urine SMALL (A) NEGATIVE   Ketones, ur NEGATIVE NEGATIVE mg/dL   Protein, ur NEGATIVE NEGATIVE mg/dL   Nitrite NEGATIVE NEGATIVE   Leukocytes, UA NEGATIVE NEGATIVE    Comment: MICROSCOPIC NOT DONE ON URINES WITH NEGATIVE PROTEIN, BLOOD, LEUKOCYTES, NITRITE, OR GLUCOSE <1000 mg/dL.  I-Stat Troponin, ED (not at [facility])     Status: None   Collection Time: 04/01/15  9:45 PM  Result Value Ref Range   Troponin i, poc 0.00 0.00 - 0.08 ng/mL   Comment 3            Comment: Due to the release kinetics  of cTnI, a negative result within the first hours of the onset of symptoms does not rule out myocardial infarction with certainty. If myocardial infarction is still suspected, repeat the test at appropriate intervals.   RPR     Status: None   Collection Time: 04/01/15 10:25 PM  Result Value Ref Range   RPR Ser Ql Non Reactive Non Reactive    Comment: (NOTE) Performed At: [facility] [address] [place], [place] [id number] Lindon Romp MD [id number]   HIV antibody     Status: None   Collection Time: 04/01/15 10:25 PM  Result Value Ref Range   HIV Screen 4th Generation wRfx Non Reactive Non Reactive    Comment: (NOTE) Performed At: [facility] [address]  [place], [place] [id number] Lindon Romp MD [id number]   I-stat troponin, ED     Status: None   Collection Time: 04/02/15  1:12 AM  Result Value Ref Range   Troponin i, poc 0.01 0.00 - 0.08 ng/mL   Comment 3            Comment: Due to the release kinetics of cTnI, a negative result within the first hours of the onset of symptoms does not rule out myocardial infarction with certainty. If myocardial infarction is still suspected, repeat the test at appropriate intervals.   I-stat troponin, ED     Status: None   Collection Time: 04/02/15  8:36 AM  Result Value Ref Range   Troponin i, poc 0.00 0.00 - 0.08 ng/mL   Comment 3            Comment: Due to the release kinetics of cTnI, a negative result within the first hours of the onset of symptoms does not rule out myocardial infarction with certainty. If myocardial infarction is still suspected, repeat the test at appropriate intervals.     Metabolic Disorder Labs:  No results found for: HGBA1C, MPG No results found for: PROLACTIN No results found for: CHOL, TRIG, HDL, CHOLHDL, VLDL, LDLCALC  Current Medications: Current Facility-Administered Medications  Medication Dose Route Frequency Provider Last Rate Last Dose  . acetaminophen (TYLENOL) tablet 650 mg  650 mg Oral Q6H PRN Kerrie Buffalo, NP      . alum & mag hydroxide-simeth (MAALOX/MYLANTA) 200-200-20 MG/5ML suspension 30 mL  30 mL Oral Q4H PRN Kerrie Buffalo, NP      . hydrOXYzine (ATARAX/VISTARIL) tablet 50 mg  50 mg Oral TID PRN Kerrie Buffalo, NP      . magnesium hydroxide (MILK OF MAGNESIA) suspension 30 mL  30 mL Oral Daily PRN Kerrie Buffalo, NP      . methocarbamol (ROBAXIN) tablet 750 mg  750 mg Oral Q6H PRN Kerrie Buffalo, NP   750 mg at 04/03/15 1029  . nicotine (NICODERM CQ - dosed in mg/24 hours) patch 21 mg  21 mg Transdermal Daily Kerrie Buffalo, NP      . pantoprazole (PROTONIX) EC tablet 40 mg  40 mg Oral Daily Laverle Hobby, PA-C   40 mg at 04/03/15  0753  . traZODone (DESYREL) tablet 50 mg  50 mg Oral QHS PRN Laverle Hobby, PA-C   25 mg at 04/02/15 2217   PTA Medications: Prescriptions prior to admission  Medication Sig Dispense Refill Last Dose  . ranitidine (ZANTAC) 150 MG tablet Take 150 mg by mouth as needed for heartburn.       Musculoskeletal: Strength & Muscle Tone: within normal limits Gait & Station: normal Patient leans: N/A  Psychiatric Specialty Exam: Physical Exam  Vitals reviewed.   Review of Systems  All other systems reviewed and are negative.   Blood pressure 130/81, pulse 70, temperature 98.2 F (36.8 C), temperature source Oral, resp. rate 16, height _0  (1.727 m), weight 86.183 kg (190 lb).Body mass index is 28.9 kg/(m^2).  General Appearance: Neat  Eye Contact::  Good  Speech:  Pressured  Volume:  Normal  Mood:  Anxious and Euthymic  Affect:  Appropriate  Thought Process:  Circumstantial  Orientation:  Full (Time, Place, and Person)  Thought Content:  Rumination  Suicidal Thoughts:  No  Homicidal Thoughts:  No  Memory:  Immediate;   Good Recent;   Good Remote;   Good  Judgement:  Fair  Insight:  Fair  Psychomotor Activity:  Normal  Concentration:  Good  Recall:  Good  Fund of Knowledge:Good  Language: Good  Akathisia:  Negative  Handed:  Right  AIMS (if indicated):     Assets:  Desire for Improvement Resilience Social Support  ADL's:  Intact  Cognition: WNL  Sleep:  poor   Treatment Plan Summary: Daily contact with patient to assess and evaluate symptoms and progress in treatment and Medication management  Observation Level/Precautions:  Continuous Observation  Laboratory:  per ED  Psychotherapy:  OBS Counselor  Medications:  As per medlist  Consultations:  psych  Discharge Concerns:  safety  Estimated LOS:  OBS stay  Other:     I certify that OBS unit services furnished can reasonably be expected to improve the patient's condition.    Freda Munro May Carmella Kees  AGNP-BC 11/17/201611:43 AM

## 2015-04-25 ENCOUNTER — Emergency Department (HOSPITAL_COMMUNITY)
Admission: EM | Admit: 2015-04-25 | Discharge: 2015-04-25 | Disposition: A | Discharge status: Home / Self Care | Payer: Self-pay | Attending: Emergency Medicine | Admitting: Emergency Medicine

## 2015-04-25 ENCOUNTER — Encounter (HOSPITAL_COMMUNITY): Payer: Self-pay

## 2015-04-25 DIAGNOSIS — Z8639 Personal history of other endocrine, nutritional and metabolic disease: Secondary | ICD-10-CM | POA: Insufficient documentation

## 2015-04-25 DIAGNOSIS — F17210 Nicotine dependence, cigarettes, uncomplicated: Secondary | ICD-10-CM | POA: Insufficient documentation

## 2015-04-25 DIAGNOSIS — I10 Essential (primary) hypertension: Secondary | ICD-10-CM | POA: Insufficient documentation

## 2015-04-25 DIAGNOSIS — Z79899 Other long term (current) drug therapy: Secondary | ICD-10-CM | POA: Insufficient documentation

## 2015-04-25 DIAGNOSIS — L03011 Cellulitis of right finger: Secondary | ICD-10-CM | POA: Insufficient documentation

## 2015-04-25 DIAGNOSIS — F329 Major depressive disorder, single episode, unspecified: Secondary | ICD-10-CM | POA: Insufficient documentation

## 2015-04-25 MED ORDER — CEPHALEXIN 500 MG PO CAPS: 500.0000 mg | ORAL_CAPSULE | Freq: Four times a day (QID) | ORAL | Status: DC

## 2015-04-25 MED ORDER — LIDOCAINE HCL (PF) 2 % IJ SOLN
10.0000 mL | Freq: Once | INTRAMUSCULAR | Status: AC
  Administered 2015-04-25: 10 mL via INTRADERMAL
  Filled 2015-04-25: qty 10

## 2015-04-25 NOTE — ED Notes (Signed)
Pt verbalized understanding of d/c instructions, prescriptions, and follow-up care. No further questions/concerns, VSS, ambulatory w/ steady gait (refused wheelchair) 

## 2015-04-25 NOTE — ED Provider Notes (Signed)
CSN: [id number]     Arrival date & time 04/25/15  0415 History   First MD Initiated Contact with Patient 04/25/15 [id number]     Chief Complaint  Patient presents with  . Hand Pain     (Consider location/radiation/quality/duration/timing/severity/associated sxs/prior Treatment) HPI Comments: Patient is a 44 year old male with no sig and past medical history. He presents with right fourth finger pain and swelling. He denies any injury or trauma, but does bite his nails  Patient is a 44 y.o. male presenting with hand pain. The history is provided by the patient.  Hand Pain This is a new problem. The current episode started 2 days ago. The problem occurs constantly. The problem has been gradually worsening. Nothing aggravates the symptoms. Nothing relieves the symptoms. He has tried nothing for the symptoms.    Past Medical History  Diagnosis Date  . Hypertension   . Hypercholesteremia   . Sleep apnea   . Bipolar 1 disorder ([facility])   . Depression    History reviewed. No pertinent past surgical history. History reviewed. No pertinent family history. Social History  Substance Use Topics  . Smoking status: Current Every Day Smoker    Types: Cigarettes  . Smokeless tobacco: None  . Alcohol Use: No    Review of Systems  All other systems reviewed and are negative.     Allergies  Review of patient's allergies indicates no known allergies.  Home Medications   Prior to Admission medications   Medication Sig Start Date End Date Taking? Authorizing Provider  hydrOXYzine (ATARAX/VISTARIL) 50 MG tablet Take 1 tablet (50 mg total) by mouth 3 (three) times daily as needed for anxiety (order is for x 1 NOW). 04/03/15   Adonis BrookSheila Agustin, NP  methocarbamol (ROBAXIN) 750 MG tablet Take 1 tablet (750 mg total) by mouth every 6 (six) hours as needed for muscle spasms. 04/03/15   Adonis BrookSheila Agustin, NP  mirtazapine (REMERON) 7.5 MG tablet Take 1 tablet (7.5 mg total) by mouth at bedtime. 04/03/15    Adonis BrookSheila Agustin, NP   BP 151/96 mmHg  Pulse 73  Temp(Src) 97.6 F (36.4 C) (Oral)  Resp 16  SpO2 98% Physical Exam  Constitutional: He is oriented to person, place, and time. He appears well-developed and well-nourished. No distress.  HENT:  Head: Normocephalic and atraumatic.  Neck: Normal range of motion. Neck supple.  Neurological: He is alert and oriented to person, place, and time.  Skin: Skin is warm and dry. He is not diaphoretic.  The right fourth finger is noted to have swelling, redness, and fluctuance over the cuticle consistent with a paronychia  Nursing note and vitals reviewed.   ED Course  Procedures (including critical care time) Labs Review Labs Reviewed - No data to display  Imaging Review No results found. I have personally reviewed and evaluated these images and lab results as part of my medical decision-making.   EKG Interpretation None      MDM   Final diagnoses:  None    This appears to be a paronychia. A digital block was performed using 2% lidocaine. A #11 blade was used to incise the parents via. There was purulent fluid expressed. A dressing will be applied. He will be treated with Keflex, warm soaks, and when necessary return.    Geoffery Lyonsouglas Cardale Dorer, MD 04/25/15 [phone]

## 2015-04-25 NOTE — ED Notes (Signed)
Pt has swelling to right 4th finger x 3 days. Pt states "I have been picking at the finger and I think this is an ingrown nail." Mild edema noted to top of right 4th finger.

## 2015-04-25 NOTE — Discharge Instructions (Signed)
Keflex as prescribed.  Apply warm soaks as frequently as possible for the next several days.  When not soaking, keep covered with a Band-Aid.  Return to the ER symptoms significantly worsen or change.   Paronychia Paronychia is an infection of the skin that surrounds a nail. It usually affects the skin around a fingernail, but it may also occur near a toenail. It often causes pain and swelling around the nail. This condition may come on suddenly or develop over a longer period. In some cases, a collection of pus (abscess) can form near or under the nail. Usually, paronychia is not serious and it clears up with treatment. CAUSES This condition may be caused by bacteria or fungi. It is commonly caused by either Streptococcus or Staphylococcus bacteria. The bacteria or fungi often cause the infection by getting into the affected area through an opening in the skin, such as a cut or a hangnail. RISK FACTORS This condition is more likely to develop in:  People who get their hands wet often, such as those who work as Fish farm manager, bartenders, or nurses.  People who bite their fingernails or suck their thumbs.  People who trim their nails too short.  People who have hangnails or injured fingertips.  People who get manicures.  People who have diabetes. SYMPTOMS Symptoms of this condition include:  Redness and swelling of the skin near the nail.  Tenderness around the nail when you touch the area.  Pus-filled bumps under the cuticle. The cuticle is the skin at the base or sides of the nail.  Fluid or pus under the nail.  Throbbing pain in the area. DIAGNOSIS This condition is usually diagnosed with a physical exam. In some cases, a sample of pus may be taken from an abscess to be tested in a lab. This can help to determine what type of bacteria or fungi is causing the condition. TREATMENT Treatment for this condition depends on the cause and severity of the condition. If the condition  is mild, it may clear up on its own in a few days. Your health care provider may recommend soaking the affected area in warm water a few times a day. When treatment is needed, the options may include:  Antibiotic medicine, if the condition is caused by a bacterial infection.  Antifungal medicine, if the condition is caused by a fungal infection.  Incision and drainage, if an abscess is present. In this procedure, the health care provider will cut open the abscess so the pus can drain out. HOME CARE INSTRUCTIONS  Soak the affected area in warm water if directed to do so by your health care provider. You may be told to do this for 20 minutes, 2-3 times a day. Keep the area dry in between soakings.  Take medicines only as directed by your health care provider.  If you were prescribed an antibiotic medicine, finish all of it even if you start to feel better.  Keep the affected area clean.  Do not try to drain a fluid-filled bump yourself.  If you will be washing dishes or performing other tasks that require your hands to get wet, wear rubber gloves. You should also wear gloves if your hands might come in contact with irritating substances, such as cleaners or chemicals.  Follow your health care provider's instructions about:  Wound care.  Bandage (dressing) changes and removal. SEEK MEDICAL CARE IF:  Your symptoms get worse or do not improve with treatment.  You have a fever or  chills.  You have redness spreading from the affected area.  You have continued or increased fluid, blood, or pus coming from the affected area.  Your finger or knuckle becomes swollen or is difficult to move.   This information is not intended to replace advice given to you by your health care provider. Make sure you discuss any questions you have with your health care provider.   Document Released: 10/27/2000 Document Revised: 09/17/2014 Document Reviewed: 04/10/2014 Elsevier Interactive Patient  Education [employer]2016 [employer] Inc.

## 2015-05-10 ENCOUNTER — Emergency Department (HOSPITAL_COMMUNITY)
Admission: EM | Admit: 2015-05-10 | Discharge: 2015-05-10 | Disposition: A | Discharge status: Home / Self Care | Payer: Self-pay | Attending: Emergency Medicine | Admitting: Emergency Medicine

## 2015-05-10 ENCOUNTER — Encounter (HOSPITAL_COMMUNITY): Payer: Self-pay | Admitting: Emergency Medicine

## 2015-05-10 DIAGNOSIS — Z8669 Personal history of other diseases of the nervous system and sense organs: Secondary | ICD-10-CM | POA: Insufficient documentation

## 2015-05-10 DIAGNOSIS — F17210 Nicotine dependence, cigarettes, uncomplicated: Secondary | ICD-10-CM | POA: Insufficient documentation

## 2015-05-10 DIAGNOSIS — Z79899 Other long term (current) drug therapy: Secondary | ICD-10-CM | POA: Insufficient documentation

## 2015-05-10 DIAGNOSIS — Z8639 Personal history of other endocrine, nutritional and metabolic disease: Secondary | ICD-10-CM | POA: Insufficient documentation

## 2015-05-10 DIAGNOSIS — Z792 Long term (current) use of antibiotics: Secondary | ICD-10-CM | POA: Insufficient documentation

## 2015-05-10 DIAGNOSIS — I10 Essential (primary) hypertension: Secondary | ICD-10-CM | POA: Insufficient documentation

## 2015-05-10 DIAGNOSIS — L03011 Cellulitis of right finger: Secondary | ICD-10-CM

## 2015-05-10 DIAGNOSIS — F329 Major depressive disorder, single episode, unspecified: Secondary | ICD-10-CM | POA: Insufficient documentation

## 2015-05-10 LAB — CBG MONITORING, ED: GLUCOSE-CAPILLARY: 105 mg/dL — AB (ref 65–99)

## 2015-05-10 MED ORDER — CEPHALEXIN 250 MG PO CAPS
500.0000 mg | ORAL_CAPSULE | Freq: Once | ORAL | Status: AC
  Administered 2015-05-10: 500 mg via ORAL
  Filled 2015-05-10: qty 2

## 2015-05-10 MED ORDER — LIDOCAINE HCL 2 % IJ SOLN
5.0000 mL | Freq: Once | INTRAMUSCULAR | Status: AC
  Administered 2015-05-10: 100 mg
  Filled 2015-05-10 (×2): qty 20

## 2015-05-10 MED ORDER — CEPHALEXIN 500 MG PO CAPS: 500.0000 mg | ORAL_CAPSULE | Freq: Three times a day (TID) | ORAL | Status: DC

## 2015-05-10 MED ORDER — ACETAMINOPHEN 325 MG PO TABS
650.0000 mg | ORAL_TABLET | Freq: Once | ORAL | Status: AC
  Administered 2015-05-10: 650 mg via ORAL
  Filled 2015-05-10: qty 2

## 2015-05-10 NOTE — ED Notes (Signed)
Pt from home with c/o right 4th digit finger infection.  Seen for the same earlier this month.  Pt reports finger pain is getting worse and took all of his abx.  NAD, A&O.

## 2015-05-10 NOTE — ED Provider Notes (Signed)
CSN: [id number]     Arrival date & time 05/10/15  0720 History   First MD Initiated Contact with Patient 05/10/15 [phone]     No chief complaint on file.  chief complaint painful finger   (Consider location/radiation/quality/duration/timing/severity/associated sxs/prior Treatment) HPI Complains of painful right ring finger, distal family onset beginning of December 2016. Patient seen in the emergency department 04/25/2015 had paronychia of right ring finger in size and drained. Placed on Keflex. He reports he's been washing the wound daily with soap water and alcohol and finger remains painful. No other associated symptoms. Pain is worse with pressing on his distal phalanx not improved by anything. Past Medical History  Diagnosis Date  . Hypertension   . Hypercholesteremia   . Sleep apnea   . Bipolar 1 disorder (HCC)   . Depression    No past surgical history on file. No family history on file. Social History  Substance Use Topics  . Smoking status: Current Every Day Smoker    Types: Cigarettes  . Smokeless tobacco: Not on file  . Alcohol Use: No    positive alcohol, smokes cocaine. No IV drug use Review of Systems  Constitutional: Negative.   Musculoskeletal: Positive for arthralgias.       Pain in right ring finger      Allergies  Review of patient's allergies indicates no known allergies.  Home Medications   Prior to Admission medications   Medication Sig Start Date End Date Taking? Authorizing Provider  cephALEXin (KEFLEX) 500 MG capsule Take 1 capsule (500 mg total) by mouth 4 (four) times daily. 04/25/15   Geoffery Lyons, MD  hydrOXYzine (ATARAX/VISTARIL) 50 MG tablet Take 1 tablet (50 mg total) by mouth 3 (three) times daily as needed for anxiety (order is for x 1 NOW). 04/03/15   Adonis Brook, NP  methocarbamol (ROBAXIN) 750 MG tablet Take 1 tablet (750 mg total) by mouth every 6 (six) hours as needed for muscle spasms. 04/03/15   Adonis Brook, NP  mirtazapine  (REMERON) 7.5 MG tablet Take 1 tablet (7.5 mg total) by mouth at bedtime. 04/03/15   Adonis Brook, NP   BP 120/71 mmHg  Pulse 84  Temp(Src) 98.3 F (36.8 C) (Oral)  Resp 20  SpO2 98% Physical Exam  Constitutional: He appears well-developed and well-nourished.  HENT:  Head: Normocephalic and atraumatic.  Eyes: EOM are normal.  Neck: Neck supple.  Cardiovascular: Normal rate.   Pulmonary/Chest: Effort normal.  Abdominal: He exhibits no distension.  Genitourinary: Penis normal.  Musculoskeletal: Normal range of motion. He exhibits no edema.  Right ring finger with paronychia, tender distal phalanx. All other extremity is without redness swelling or tenderness neurovascular intact  Neurological: No cranial nerve deficit. Coordination normal.  Skin: Skin is warm and dry.  Nursing note and vitals reviewed.   ED Course  Procedures (including critical care time) Labs Review Labs Reviewed - No data to display  Imaging Review No results found. I have personally reviewed and evaluated these images and lab results as part of my medical decision-making.   EKG Interpretation None     Procedure paronychia of right ring finger was incised and drained. Timeout performed A digital block with 2% lidocaine was administered. A #11 blade was slipped under the cuticle, copious pus drained from the incision his hand was irrigated with copious tap water sterile bandage with antibiotic ointment applied. Patient ate a full meal in the emergency department MDM  Plan prescription Keflex. Tylenol for pain Wound recheck 2-3  days Final diagnoses:  None   diagnosis paronychia right ring finger      Doug SouSam Rain Friedt, MD 05/10/15 1054

## 2015-05-10 NOTE — ED Notes (Signed)
Checked patient cbg it was 105 notitied RN of blood sugar

## 2015-05-10 NOTE — Discharge Instructions (Signed)
Paronychia Take Tylenol as directed for pain.  Get your finger rechecked in 2 or 3 days at an urgent care center Paronychia is an infection of the skin that surrounds a nail. It usually affects the skin around a fingernail, but it may also occur near a toenail. It often causes pain and swelling around the nail. This condition may come on suddenly or develop over a longer period. In some cases, a collection of pus (abscess) can form near or under the nail. Usually, paronychia is not serious and it clears up with treatment. CAUSES This condition may be caused by bacteria or fungi. It is commonly caused by either Streptococcus or Staphylococcus bacteria. The bacteria or fungi often cause the infection by getting into the affected area through an opening in the skin, such as a cut or a hangnail. RISK FACTORS This condition is more likely to develop in:  People who get their hands wet often, such as those who work as Fish farm managerdishwashers, bartenders, or nurses.  People who bite their fingernails or suck their thumbs.  People who trim their nails too short.  People who have hangnails or injured fingertips.  People who get manicures.  People who have diabetes. SYMPTOMS Symptoms of this condition include:  Redness and swelling of the skin near the nail.  Tenderness around the nail when you touch the area.  Pus-filled bumps under the cuticle. The cuticle is the skin at the base or sides of the nail.  Fluid or pus under the nail.  Throbbing pain in the area. DIAGNOSIS This condition is usually diagnosed with a physical exam. In some cases, a sample of pus may be taken from an abscess to be tested in a lab. This can help to determine what type of bacteria or fungi is causing the condition. TREATMENT Treatment for this condition depends on the cause and severity of the condition. If the condition is mild, it may clear up on its own in a few days. Your health care provider may recommend soaking the  affected area in warm water a few times a day. When treatment is needed, the options may include:  Antibiotic medicine, if the condition is caused by a bacterial infection.  Antifungal medicine, if the condition is caused by a fungal infection.  Incision and drainage, if an abscess is present. In this procedure, the health care provider will cut open the abscess so the pus can drain out. HOME CARE INSTRUCTIONS  Soak the affected area in warm water if directed to do so by your health care provider. You may be told to do this for 20 minutes, 2-3 times a day. Keep the area dry in between soakings.  Take medicines only as directed by your health care provider.  If you were prescribed an antibiotic medicine, finish all of it even if you start to feel better.  Keep the affected area clean.  Do not try to drain a fluid-filled bump yourself.  If you will be washing dishes or performing other tasks that require your hands to get wet, wear rubber gloves. You should also wear gloves if your hands might come in contact with irritating substances, such as cleaners or chemicals.  Follow your health care provider's instructions about:  Wound care.  Bandage (dressing) changes and removal. SEEK MEDICAL CARE IF:  Your symptoms get worse or do not improve with treatment.  You have a fever or chills.  You have redness spreading from the affected area.  You have continued or increased  fluid, blood, or pus coming from the affected area.  Your finger or knuckle becomes swollen or is difficult to move.   This information is not intended to replace advice given to you by your health care provider. Make sure you discuss any questions you have with your health care provider.   Document Released: 10/27/2000 Document Revised: 09/17/2014 Document Reviewed: 04/10/2014 Elsevier Interactive Patient Education [employer]

## 2015-05-10 NOTE — ED Notes (Signed)
Suture cart at bedside 

## 2015-05-14 ENCOUNTER — Emergency Department (HOSPITAL_COMMUNITY): Payer: Self-pay

## 2015-05-14 ENCOUNTER — Emergency Department (HOSPITAL_COMMUNITY)
Admission: EM | Admit: 2015-05-14 | Discharge: 2015-05-14 | Disposition: A | Discharge status: Home / Self Care | Payer: Self-pay | Attending: Emergency Medicine | Admitting: Emergency Medicine

## 2015-05-14 ENCOUNTER — Encounter (HOSPITAL_COMMUNITY): Payer: Self-pay | Admitting: Emergency Medicine

## 2015-05-14 DIAGNOSIS — Z590 Homelessness: Secondary | ICD-10-CM | POA: Insufficient documentation

## 2015-05-14 DIAGNOSIS — I10 Essential (primary) hypertension: Secondary | ICD-10-CM | POA: Insufficient documentation

## 2015-05-14 DIAGNOSIS — Z8659 Personal history of other mental and behavioral disorders: Secondary | ICD-10-CM | POA: Insufficient documentation

## 2015-05-14 DIAGNOSIS — F17210 Nicotine dependence, cigarettes, uncomplicated: Secondary | ICD-10-CM | POA: Insufficient documentation

## 2015-05-14 DIAGNOSIS — R079 Chest pain, unspecified: Secondary | ICD-10-CM

## 2015-05-14 DIAGNOSIS — Z8639 Personal history of other endocrine, nutritional and metabolic disease: Secondary | ICD-10-CM | POA: Insufficient documentation

## 2015-05-14 HISTORY — DX: Homelessness unspecified: Z59.00

## 2015-05-14 HISTORY — DX: Homelessness: Z59.0

## 2015-05-14 LAB — CBC
HCT: 40.3 % (ref 39.0–52.0)
HEMOGLOBIN: 12.9 g/dL — AB (ref 13.0–17.0)
MCH: 26.9 pg (ref 26.0–34.0)
MCHC: 32 g/dL (ref 30.0–36.0)
MCV: 84 fL (ref 78.0–100.0)
Platelets: 240 10*3/uL (ref 150–400)
RBC: 4.8 MIL/uL (ref 4.22–5.81)
RDW: 13.4 % (ref 11.5–15.5)
WBC: 7.8 10*3/uL (ref 4.0–10.5)

## 2015-05-14 LAB — BASIC METABOLIC PANEL
ANION GAP: 8 (ref 5–15)
BUN: 9 mg/dL (ref 6–20)
CHLORIDE: 110 mmol/L (ref 101–111)
CO2: 24 mmol/L (ref 22–32)
Calcium: 8.8 mg/dL — ABNORMAL LOW (ref 8.9–10.3)
Creatinine, Ser: 1.15 mg/dL (ref 0.61–1.24)
GFR calc non Af Amer: >60 mL/min (ref >60)
Glucose, Bld: 107 mg/dL — ABNORMAL HIGH (ref 65–99)
POTASSIUM: 3.4 mmol/L — AB (ref 3.5–5.1)
SODIUM: 142 mmol/L (ref 135–145)

## 2015-05-14 LAB — I-STAT TROPONIN, ED: TROPONIN I, POC: 0 ng/mL (ref 0.00–0.08)

## 2015-05-14 MED ORDER — ASPIRIN 81 MG PO CHEW
324.0000 mg | CHEWABLE_TABLET | Freq: Once | ORAL | Status: AC
  Administered 2015-05-14: 324 mg via ORAL
  Filled 2015-05-14: qty 4

## 2015-05-14 NOTE — ED Provider Notes (Signed)
CSN: [id number]     Arrival date & time 05/14/15  0451 History   First MD Initiated Contact with Patient 05/14/15 [phone]     Chief Complaint  Patient presents with  . Chest Pain     (Consider location/radiation/quality/duration/timing/severity/associated sxs/prior Treatment) Patient is a 44 y.o. male presenting with chest pain.  Chest Pain Pain location:  L chest Pain quality: sharp   Pain radiates to:  Does not radiate Pain radiates to the back: no   Pain severity:  Mild Onset quality:  Sudden Duration:  5 minutes Timing:  Unable to specify Progression:  Resolved Chronicity:  Recurrent Context: drug use   Worsened by:  Nothing tried Associated symptoms: no altered mental status, no cough, no diaphoresis, no dizziness, no fever, no headache, no lower extremity edema, no numbness, no shortness of breath, no syncope, not vomiting and no weakness   Risk factors: hypertension and smoking   Risk factors: no coronary artery disease, no diabetes mellitus and no prior DVT/PE      Henry Hicks is a 44 y.o homeless male who presents to the ED today with complaints of chest pain. Pt states that he sudden onset left sided chest pain while he was walking last night around 10pm. Pain did not radiate. The pain lasted for approximately 5 minutes and then resolved. Patient reports using cocaine. Patient has experienced this pain before approximately one month ago and was seen in the emergency department for this. At that time he had a normal EKG, normal serial troponins and was medically cleared. He was admitted to behavioral health at that time due to suicidal ideation. He denies suicidal edition today. Patient denies associated shortness of breath, numbness or tingling in his hands, weakness, dizziness, loss of consciousness, blurry vision, abdominal pain, N/V/D. Patient smokes half a pack of cigarettes per day.   Past Medical History  Diagnosis Date  . Hypertension   . Hypercholesteremia   .  Sleep apnea   . Bipolar 1 disorder (HCC)   . Depression   . Homelessness    History reviewed. No pertinent past surgical history. No family history on file. Social History  Substance Use Topics  . Smoking status: Current Every Day Smoker    Types: Cigarettes  . Smokeless tobacco: None  . Alcohol Use: Yes    Review of Systems  Constitutional: Negative for fever and diaphoresis.  Respiratory: Negative for cough and shortness of breath.   Cardiovascular: Positive for chest pain. Negative for syncope.  Gastrointestinal: Negative for vomiting.  Neurological: Negative for dizziness, weakness, numbness and headaches.  All other systems reviewed and are negative.     Allergies  Review of patient's allergies indicates no known allergies.  Home Medications   Prior to Admission medications   Medication Sig Start Date End Date Taking? Authorizing Provider  hydrOXYzine (ATARAX/VISTARIL) 50 MG tablet Take 1 tablet (50 mg total) by mouth 3 (three) times daily as needed for anxiety (order is for x 1 NOW). 04/03/15  Yes Adonis Brook, NP  cephALEXin (KEFLEX) 500 MG capsule Take 1 capsule (500 mg total) by mouth 3 (three) times daily. Patient not taking: Reported on 05/14/2015 05/10/15   Doug Sou, MD   BP 126/85 mmHg  Pulse 74  Temp(Src) 98 F (36.7 C) (Oral)  Resp 11  Ht  (1.702 m)  Wt 81.194 kg  BMI 28.03 kg/m2  SpO2 99% Physical Exam  Constitutional: He is oriented to person, place, and time. He appears well-developed and well-nourished. No  distress.  Pt is sleeping, continued to nod off to sleep during exam.  HENT:  Head: Normocephalic and atraumatic.  Mouth/Throat: No oropharyngeal exudate.  Eyes: Conjunctivae and EOM are normal. Pupils are equal, round, and reactive to light. Right eye exhibits no discharge. Left eye exhibits no discharge. No scleral icterus.  Neck: Normal range of motion. Neck supple.  Cardiovascular: Normal rate, regular rhythm, normal heart  sounds and intact distal pulses.  Exam reveals no gallop and no friction rub.   No murmur heard. Pulmonary/Chest: Effort normal and breath sounds normal. No respiratory distress. He has no wheezes. He has no rales. He exhibits no tenderness.  Abdominal: Soft. He exhibits no distension. There is no tenderness. There is no guarding.  Musculoskeletal: Normal range of motion. He exhibits no edema.  Lymphadenopathy:    He has no cervical adenopathy.  Neurological: He is alert and oriented to person, place, and time. No cranial nerve deficit. He exhibits normal muscle tone. Coordination normal.  Strength 5/5 throughout. No sensory deficits.  No gait abnormality.  Skin: Skin is warm and dry. No rash noted. He is not diaphoretic. No erythema. No pallor.  Psychiatric: He has a normal mood and affect.  Nursing note and vitals reviewed.   ED Course  Procedures (including critical care time) Labs Review Labs Reviewed  BASIC METABOLIC PANEL - Abnormal; Notable for the following:    Potassium 3.4 (*)    Glucose, Bld 107 (*)    Calcium 8.8 (*)    All other components within normal limits  CBC - Abnormal; Notable for the following:    Hemoglobin 12.9 (*)    All other components within normal limits  I-STAT TROPOININ, ED    Imaging Review Dg Chest 2 View  05/14/2015  CLINICAL DATA:  Acute onset of right-sided shoulder pain and cough. Initial encounter. EXAM: CHEST  2 VIEW COMPARISON:  Chest radiograph performed 04/01/2015 FINDINGS: The lungs are well-aerated. Pulmonary vascularity is at the upper limits of normal. There is no evidence of focal opacification, pleural effusion or pneumothorax. The heart is normal in size; the mediastinal contour is within normal limits. No acute osseous abnormalities are seen. IMPRESSION: No acute cardiopulmonary process seen. Electronically Signed   By: Roanna RaiderJeffery  Chang M.D.   On: 05/14/2015 06:06   I have personally reviewed and evaluated these images and lab results  as part of my medical decision-making.   EKG Interpretation   Date/Time:  Wednesday May 14 2015 04:55:00 EST Ventricular Rate:  89 PR Interval:  132 QRS Duration: 80 QT Interval:  360 QTC Calculation: 438 R Axis:   72 Text Interpretation:  Normal sinus rhythm Nonspecific T wave abnormality  Abnormal ECG No significant change was found Confirmed by CAMPOS  MD,  KEVIN ([id number]) on 05/14/2015 6:30:41 AM      MDM   Final diagnoses:  Chest pain, unspecified chest pain type    Patient is to be discharged with recommendation to follow up with PCP in regards to today's hospital visit. Chest pain is not likely of cardiac or pulmonary etiology d/t presentation, perc negative, VSS, no tracheal deviation, no JVD or new murmur, RRR, breath sounds equal bilaterally, EKG without acute abnormalities, negative troponin, and negative CXR. Low HEART score. Pt has been advised to return to the ED if CP becomes exertional, associated with diaphoresis or nausea, radiates to left jaw/arm, worsens or becomes concerning in any way. Encourage cessation of cocaine use as this is a cause of chest pain. Pt  appears reliable for follow up and is agreeable to discharge.   Case has been discussed with  by Dr. Patria Mane who agrees with the above plan to discharge.      Lester Kinsman [place], PA-C 05/15/15 1221  Azalia Bilis, MD 05/16/15 [phone]

## 2015-05-14 NOTE — Discharge Instructions (Signed)
Chest Pain Observation °It is often hard to give a specific diagnosis for the cause of chest pain. Among other possibilities your symptoms might be caused by inadequate oxygen delivery to your heart (angina). Angina that is not treated or evaluated can lead to a heart attack (myocardial infarction) or death. °Blood tests, electrocardiograms, and X-rays may have been done to help determine a possible cause of your chest pain. After evaluation and observation, your health care provider has determined that it is unlikely your pain was caused by an unstable condition that requires hospitalization. However, a full evaluation of your pain may need to be completed, with additional diagnostic testing as directed. It is very important to keep your follow-up appointments. Not keeping your follow-up appointments could result in permanent heart damage, disability, or death. If there is any problem keeping your follow-up appointments, you must call your health care provider. °HOME CARE INSTRUCTIONS  °Due to the slight chance that your pain could be angina, it is important to follow your health care provider's treatment plan and also maintain a healthy lifestyle: °· Maintain or work toward achieving a healthy weight. °· Stay physically active and exercise regularly. °· Decrease your salt intake. °· Eat a balanced, healthy diet. Talk to a dietitian to learn about heart-healthy foods. °· Increase your fiber intake by including whole grains, vegetables, fruits, and nuts in your diet. °· Avoid situations that cause stress, anger, or depression. °· Take medicines as advised by your health care provider. Report any side effects to your health care provider. Do not stop medicines or adjust the dosages on your own. °· Quit smoking. Do not use nicotine patches or gum until you check with your health care provider. °· Keep your blood pressure, blood sugar, and cholesterol levels within normal limits. °· Limit alcohol intake to no more than  1 drink per day for women who are not pregnant and 2 drinks per day for men. °· Do not abuse drugs. °SEEK IMMEDIATE MEDICAL CARE IF: °You have severe chest pain or pressure which may include symptoms such as: °· You feel pain or pressure in your arms, neck, jaw, or back. °· You have severe back or abdominal pain, feel sick to your stomach (nauseous), or throw up (vomit). °· You are sweating profusely. °· You are having a fast or irregular heartbeat. °· You feel short of breath while at rest. °· You notice increasing shortness of breath during rest, sleep, or with activity. °· You have chest pain that does not get better after rest or after taking your usual medicine. °· You wake from sleep with chest pain. °· You are unable to sleep because you cannot breathe. °· You develop a frequent cough or you are coughing up blood. °· You feel dizzy, faint, or experience extreme fatigue. °· You develop severe weakness, dizziness, fainting, or chills. °Any of these symptoms may represent a serious problem that is an emergency. Do not wait to see if the symptoms will go away. Call your local emergency services (911 in the [place] Do not drive yourself to the hospital. °MAKE SURE YOU: °· Understand these instructions. °· Will watch your condition. °· Will get help right away if you are not doing well or get worse. °  °This information is not intended to replace advice given to you by your health care provider. Make sure you discuss any questions you have with your health care provider. °  °Document Released: 06/05/2010 Document Revised: 05/08/2013 Document Reviewed: 11/02/2012 °Elsevier Interactive Patient   Education 2016 [employer]  Nonspecific Chest Pain  Chest pain can be caused by many different conditions. There is always a chance that your pain could be related to something serious, such as a heart attack or a blood clot in your lungs. Chest pain can also be caused by conditions that are not life-threatening. If you  have chest pain, it is very important to follow up with your health care provider. CAUSES  Chest pain can be caused by:  Heartburn.  Pneumonia or bronchitis.  Anxiety or stress.  Inflammation around your heart (pericarditis) or lung (pleuritis or pleurisy).  A blood clot in your lung.  A collapsed lung (pneumothorax). It can develop suddenly on its own (spontaneous pneumothorax) or from trauma to the chest.  Shingles infection (varicella-zoster virus).  Heart attack.  Damage to the bones, muscles, and cartilage that make up your chest wall. This can include:  Bruised bones due to injury.  Strained muscles or cartilage due to frequent or repeated coughing or overwork.  Fracture to one or more ribs.  Sore cartilage due to inflammation (costochondritis). RISK FACTORS  Risk factors for chest pain may include:  Activities that increase your risk for trauma or injury to your chest.  Respiratory infections or conditions that cause frequent coughing.  Medical conditions or overeating that can cause heartburn.  Heart disease or family history of heart disease.  Conditions or health behaviors that increase your risk of developing a blood clot.  Having had chicken pox (varicella zoster). SIGNS AND SYMPTOMS Chest pain can feel like:  Burning or tingling on the surface of your chest or deep in your chest.  Crushing, pressure, aching, or squeezing pain.  Dull or sharp pain that is worse when you move, cough, or take a deep breath.  Pain that is also felt in your back, neck, shoulder, or arm, or pain that spreads to any of these areas. Your chest pain may come and go, or it may stay constant. DIAGNOSIS Lab tests or other studies may be needed to find the cause of your pain. Your health care provider may have you take a test called an ambulatory ECG (electrocardiogram). An ECG records your heartbeat patterns at the time the test is performed. You may also have other tests, such  as:  Transthoracic echocardiogram (TTE). During echocardiography, sound waves are used to create a picture of all of the heart structures and to look at how blood flows through your heart.  Transesophageal echocardiogram (TEE).This is a more advanced imaging test that obtains images from inside your body. It allows your health care provider to see your heart in finer detail.  Cardiac monitoring. This allows your health care provider to monitor your heart rate and rhythm in real time.  Holter monitor. This is a portable device that records your heartbeat and can help to diagnose abnormal heartbeats. It allows your health care provider to track your heart activity for several days, if needed.  Stress tests. These can be done through exercise or by taking medicine that makes your heart beat more quickly.  Blood tests.  Imaging tests. TREATMENT  Your treatment depends on what is causing your chest pain. Treatment may include:  Medicines. These may include:  Acid blockers for heartburn.  Anti-inflammatory medicine.  Pain medicine for inflammatory conditions.  Antibiotic medicine, if an infection is present.  Medicines to dissolve blood clots.  Medicines to treat coronary artery disease.  Supportive care for conditions that do not require medicines. This may  include:  Resting.  Applying heat or cold packs to injured areas.  Limiting activities until pain decreases. HOME CARE INSTRUCTIONS  If you were prescribed an antibiotic medicine, finish it all even if you start to feel better.  Avoid any activities that bring on chest pain.  Do not use any tobacco products, including cigarettes, chewing tobacco, or electronic cigarettes. If you need help quitting, ask your health care provider.  Do not drink alcohol.  Take medicines only as directed by your health care provider.  Keep all follow-up visits as directed by your health care provider. This is important. This includes any  further testing if your chest pain does not go away.  If heartburn is the cause for your chest pain, you may be told to keep your head raised (elevated) while sleeping. This reduces the chance that acid will go from your stomach into your esophagus.  Make lifestyle changes as directed by your health care provider. These may include:  Getting regular exercise. Ask your health care provider to suggest some activities that are safe for you.  Eating a heart-healthy diet. A registered dietitian can help you to learn healthy eating options.  Maintaining a healthy weight.  Managing diabetes, if necessary.  Reducing stress. SEEK MEDICAL CARE IF:  Your chest pain does not go away after treatment.  You have a rash with blisters on your chest.  You have a fever. SEEK IMMEDIATE MEDICAL CARE IF:   Your chest pain is worse.  You have an increasing cough, or you cough up blood.  You have severe abdominal pain.  You have severe weakness.  You faint.  You have chills.  You have sudden, unexplained chest discomfort.  You have sudden, unexplained discomfort in your arms, back, neck, or jaw.  You have shortness of breath at any time.  You suddenly start to sweat, or your skin gets clammy.  You feel nauseous or you vomit.  You suddenly feel light-headed or dizzy.  Your heart begins to beat quickly, or it feels like it is skipping beats. These symptoms may represent a serious problem that is an emergency. Do not wait to see if the symptoms will go away. Get medical help right away. Call your local emergency services (911 in the [place] Do not drive yourself to the hospital.   This information is not intended to replace advice given to you by your health care provider. Make sure you discuss any questions you have with your health care provider.  Follow up with your primary care provider for re-evaluation. Avoid use of cocaine. Encourage smoking cessation. Return to the ED if you  experience worsening of your symptoms, difficulty breathing, loss of consciousness, numbness/weakness in an extremity, severe abdominal pain, severe headache, blurry vision.

## 2015-05-14 NOTE — ED Notes (Signed)
Pt has multiple complaints. He c/o chest pain, generalized body aches and chills. Pt stated that he is "essentially homeless" and has been experiencing a lot of stress lately.

## 2015-05-14 NOTE — ED Notes (Addendum)
Pt. reports central chest pain with SOB , dry cough , left fingers numbness and upper back pain onset this evening . Pt. stated he is homeless and has been walking a lot.

## 2015-05-31 ENCOUNTER — Emergency Department (HOSPITAL_COMMUNITY): Payer: Self-pay

## 2015-05-31 ENCOUNTER — Emergency Department (HOSPITAL_COMMUNITY)
Admission: EM | Admit: 2015-05-31 | Discharge: 2015-05-31 | Disposition: A | Discharge status: Home / Self Care | Payer: Self-pay | Attending: Emergency Medicine | Admitting: Emergency Medicine

## 2015-05-31 ENCOUNTER — Encounter (HOSPITAL_COMMUNITY): Payer: Self-pay | Admitting: Emergency Medicine

## 2015-05-31 DIAGNOSIS — J069 Acute upper respiratory infection, unspecified: Secondary | ICD-10-CM | POA: Insufficient documentation

## 2015-05-31 DIAGNOSIS — Z590 Homelessness: Secondary | ICD-10-CM | POA: Insufficient documentation

## 2015-05-31 DIAGNOSIS — Z8639 Personal history of other endocrine, nutritional and metabolic disease: Secondary | ICD-10-CM | POA: Insufficient documentation

## 2015-05-31 DIAGNOSIS — Z8669 Personal history of other diseases of the nervous system and sense organs: Secondary | ICD-10-CM | POA: Insufficient documentation

## 2015-05-31 DIAGNOSIS — Z79899 Other long term (current) drug therapy: Secondary | ICD-10-CM | POA: Insufficient documentation

## 2015-05-31 DIAGNOSIS — Z8659 Personal history of other mental and behavioral disorders: Secondary | ICD-10-CM | POA: Insufficient documentation

## 2015-05-31 DIAGNOSIS — I10 Essential (primary) hypertension: Secondary | ICD-10-CM | POA: Insufficient documentation

## 2015-05-31 MED ORDER — BENZONATATE 100 MG PO CAPS: 100.0000 mg | ORAL_CAPSULE | Freq: Three times a day (TID) | ORAL | Status: DC

## 2015-05-31 NOTE — ED Notes (Signed)
See PA Assessment  

## 2015-05-31 NOTE — Discharge Instructions (Signed)
There does not appear to be an emergent cause her symptoms at this time. Your exam is reassuring in your x-ray showed no concerning findings.. Take your medications as prescribed. Follow-up with your doctor. Return to ED for any new or worsening symptoms.  Cough, Adult A cough helps to clear your throat and lungs. A cough may last only 2-3 weeks (acute), or it may last longer than 8 weeks (chronic). Many different things can cause a cough. A cough may be a sign of an illness or another medical condition. HOME CARE  Pay attention to any changes in your cough.  Take medicines only as told by your doctor.  If you were prescribed an antibiotic medicine, take it as told by your doctor. Do not stop taking it even if you start to feel better.  Talk with your doctor before you try using a cough medicine.  Drink enough fluid to keep your pee (urine) clear or pale yellow.  If the air is dry, use a cold steam vaporizer or humidifier in your home.  Stay away from things that make you cough at work or at home.  If your cough is worse at night, try using extra pillows to raise your head up higher while you sleep.  Do not smoke, and try not to be around smoke. If you need help quitting, ask your doctor.  Do not have caffeine.  Do not drink alcohol.  Rest as needed. GET HELP IF:  You have new problems (symptoms).  You cough up yellow fluid (pus).  Your cough does not get better after 2-3 weeks, or your cough gets worse.  Medicine does not help your cough and you are not sleeping well.  You have pain that gets worse or pain that is not helped with medicine.  You have a fever.  You are losing weight and you do not know why.  You have night sweats. GET HELP RIGHT AWAY IF:  You cough up blood.  You have trouble breathing.  Your heartbeat is very fast.   This information is not intended to replace advice given to you by your health care provider. Make sure you discuss any questions  you have with your health care provider.   Document Released: 01/14/2011 Document Revised: 01/22/2015 Document Reviewed: 07/10/2014 Elsevier Interactive Patient Education [employer]2016 Elsevier Inc.

## 2015-05-31 NOTE — ED Provider Notes (Signed)
CSN: [id number]     Arrival date & time 05/31/15  0442 History   First MD Initiated Contact with Patient 05/31/15 [id number]     Chief Complaint  Patient presents with  . Cough     (Consider location/radiation/quality/duration/timing/severity/associated sxs/prior Treatment) HPI Henry Hicks is a 45 y.o. male with history of homelessness, comes in for evaluation of cough. Patient reports over the past month he has had intermittent, productive cough with yellow mucus. Reports associated intermittent chills. He reports trying Mucinex with relief of his symptoms. He denies any fevers, chest pain or shortness of breath. He does report cigarette smoking and sometimes will have wheezing, but none recently. No leg swelling, recent travel or surgeries. Nothing makes the problem worse. No other modifying factors.  Past Medical History  Diagnosis Date  . Hypertension   . Hypercholesteremia   . Sleep apnea   . Bipolar 1 disorder (HCC)   . Depression   . Homelessness    History reviewed. No pertinent past surgical history. History reviewed. No pertinent family history. Social History  Substance Use Topics  . Smoking status: Current Every Day Smoker    Types: Cigarettes  . Smokeless tobacco: None  . Alcohol Use: Yes    Review of Systems A 10 point review of systems was completed and was negative except for pertinent positives and negatives as mentioned in the history of present illness     Allergies  Review of patient's allergies indicates no known allergies.  Home Medications   Prior to Admission medications   Medication Sig Start Date End Date Taking? Authorizing Provider  benzonatate (TESSALON) 100 MG capsule Take 1 capsule (100 mg total) by mouth every 8 (eight) hours. 05/31/15   Henry PeekBenjamin Hailyn Zarr, PA-C  cephALEXin (KEFLEX) 500 MG capsule Take 1 capsule (500 mg total) by mouth 3 (three) times daily. Patient not taking: Reported on 05/14/2015 05/10/15   Doug SouSam Jacubowitz, MD  hydrOXYzine  (ATARAX/VISTARIL) 50 MG tablet Take 1 tablet (50 mg total) by mouth 3 (three) times daily as needed for anxiety (order is for x 1 NOW). 04/03/15   Adonis BrookSheila Agustin, NP   BP 135/91 mmHg  Pulse 83  Temp(Src) 98.8 F (37.1 C) (Oral)  Resp 20  Ht 5\' 7"  (1.702 m)  Wt 81.647 kg  BMI 28.19 kg/m2  SpO2 99% Physical Exam  Constitutional: He is oriented to person, place, and time. He appears well-developed and well-nourished.  Well appearing male in no apparent distress.  HENT:  Head: Normocephalic and atraumatic.  Mouth/Throat: Oropharynx is clear and moist.  Eyes: Conjunctivae are normal. Pupils are equal, round, and reactive to light. Right eye exhibits no discharge. Left eye exhibits no discharge. No scleral icterus.  Neck: Neck supple.  Cardiovascular: Normal rate, regular rhythm and normal heart sounds.   Pulmonary/Chest: Effort normal and breath sounds normal. No respiratory distress. He has no wheezes. He has no rales.  Abdominal: Soft. There is no tenderness.  Musculoskeletal: Normal range of motion. He exhibits no edema or tenderness.  Neurological: He is alert and oriented to person, place, and time. No cranial nerve deficit. Coordination normal.  Cranial Nerves II-XII grossly intact  Skin: Skin is warm and dry. No rash noted.  Psychiatric: He has a normal mood and affect.  Nursing note and vitals reviewed.   ED Course  Procedures (including critical care time) Labs Review Labs Reviewed - No data to display  Imaging Review Dg Chest 2 View  05/31/2015  CLINICAL DATA:  Cough and congestion  with weakness 3 weeks. EXAM: CHEST  2 VIEW COMPARISON:  05/14/2015 and 04/01/2015 FINDINGS: Lungs are adequately inflated without consolidation or effusion. Cardiomediastinal silhouette is within normal. Mild curvature of the thoracic spine convex right as well as mild degenerative change of the spine. IMPRESSION: No active cardiopulmonary disease. Electronically Signed   By: Elberta Fortis M.D.    On: 05/31/2015 07:09   I have personally reviewed and evaluated these images and lab results as part of my medical decision-making.   EKG Interpretation None     Meds given in ED:  Medications - No data to display  Discharge Medication List as of 05/31/2015  7:40 AM    START taking these medications   Details  benzonatate (TESSALON) 100 MG capsule Take 1 capsule (100 mg total) by mouth every 8 (eight) hours., Starting 05/31/2015, Until Discontinued, Print       Filed Vitals:   05/31/15 0448 05/31/15 0734  BP: 134/88 135/91  Pulse: 94 83  Temp: 98.5 F (36.9 C) 98.8 F (37.1 C)  TempSrc: Oral Oral  Resp: 20   Height: 5\' 7"  (1.702 m)   Weight: 81.647 kg   SpO2: 100% 99%    MDM  Pt CXR negative for acute infiltrate. PERC neg, doubt PE. Patients symptoms are consistent with URI, likely viral etiology. Discussed that antibiotics are not indicated for viral infections. Pt will be discharged with symptomatic treatment.  Given prescription for antitussive. Verbalizes understanding and is agreeable with plan. Pt is hemodynamically stable & in NAD prior to dc. [facility]. The patient appears reasonably screened and/or stabilized for discharge and I doubt any other medical condition or other [facility] requiring further screening, evaluation, or treatment in the ED at this time prior to discharge.    Final diagnoses:  URI (upper respiratory infection)        Henry Peek, PA-C 05/31/15 3244  Henry Razor, MD 06/11/15 1319

## 2015-05-31 NOTE — ED Notes (Signed)
Declined W/C at D/C and was escorted to lobby by RN. 

## 2015-05-31 NOTE — ED Notes (Signed)
Patient here with productive cough x 2 weeks. States yellow mucous. Unsure of fever, but states chills at home.

## 2015-06-21 ENCOUNTER — Encounter (HOSPITAL_COMMUNITY): Payer: Self-pay | Admitting: Neurology

## 2015-06-21 ENCOUNTER — Emergency Department (HOSPITAL_COMMUNITY)
Admission: EM | Admit: 2015-06-21 | Discharge: 2015-06-21 | Disposition: A | Discharge status: Other Acute Inpatient Hospital | Payer: Federal, State, Local not specified - Other | Attending: Emergency Medicine | Admitting: Emergency Medicine

## 2015-06-21 ENCOUNTER — Inpatient Hospital Stay (HOSPITAL_COMMUNITY)
Admission: AD | Admit: 2015-06-21 | Discharge: 2015-06-25 | DRG: 885 | Disposition: A | Discharge status: Home / Self Care | Payer: Federal, State, Local not specified - Other | Source: Intra-hospital | Attending: Psychiatry | Admitting: Psychiatry

## 2015-06-21 ENCOUNTER — Encounter (HOSPITAL_COMMUNITY): Payer: Self-pay

## 2015-06-21 DIAGNOSIS — E7800 Pure hypercholesterolemia, unspecified: Secondary | ICD-10-CM | POA: Diagnosis present

## 2015-06-21 DIAGNOSIS — R45851 Suicidal ideations: Secondary | ICD-10-CM | POA: Diagnosis present

## 2015-06-21 DIAGNOSIS — Z8669 Personal history of other diseases of the nervous system and sense organs: Secondary | ICD-10-CM | POA: Insufficient documentation

## 2015-06-21 DIAGNOSIS — F17210 Nicotine dependence, cigarettes, uncomplicated: Secondary | ICD-10-CM | POA: Insufficient documentation

## 2015-06-21 DIAGNOSIS — G4730 Sleep apnea, unspecified: Secondary | ICD-10-CM | POA: Diagnosis present

## 2015-06-21 DIAGNOSIS — F329 Major depressive disorder, single episode, unspecified: Secondary | ICD-10-CM | POA: Insufficient documentation

## 2015-06-21 DIAGNOSIS — Z818 Family history of other mental and behavioral disorders: Secondary | ICD-10-CM | POA: Diagnosis not present

## 2015-06-21 DIAGNOSIS — Z79899 Other long term (current) drug therapy: Secondary | ICD-10-CM | POA: Insufficient documentation

## 2015-06-21 DIAGNOSIS — F314 Bipolar disorder, current episode depressed, severe, without psychotic features: Secondary | ICD-10-CM | POA: Diagnosis present

## 2015-06-21 DIAGNOSIS — F339 Major depressive disorder, recurrent, unspecified: Secondary | ICD-10-CM | POA: Diagnosis present

## 2015-06-21 DIAGNOSIS — F419 Anxiety disorder, unspecified: Secondary | ICD-10-CM | POA: Diagnosis present

## 2015-06-21 DIAGNOSIS — Z590 Homelessness: Secondary | ICD-10-CM

## 2015-06-21 DIAGNOSIS — Z950 Presence of cardiac pacemaker: Secondary | ICD-10-CM | POA: Insufficient documentation

## 2015-06-21 DIAGNOSIS — G4700 Insomnia, unspecified: Secondary | ICD-10-CM | POA: Diagnosis present

## 2015-06-21 DIAGNOSIS — I10 Essential (primary) hypertension: Secondary | ICD-10-CM | POA: Insufficient documentation

## 2015-06-21 DIAGNOSIS — F1410 Cocaine abuse, uncomplicated: Secondary | ICD-10-CM | POA: Diagnosis not present

## 2015-06-21 DIAGNOSIS — Z8639 Personal history of other endocrine, nutritional and metabolic disease: Secondary | ICD-10-CM | POA: Insufficient documentation

## 2015-06-21 LAB — COMPREHENSIVE METABOLIC PANEL
ALK PHOS: 52 U/L (ref 38–126)
ALT: 21 U/L (ref 17–63)
ANION GAP: 10 (ref 5–15)
AST: 33 U/L (ref 15–41)
Albumin: 3.3 g/dL — ABNORMAL LOW (ref 3.5–5.0)
BILIRUBIN TOTAL: 0.8 mg/dL (ref 0.3–1.2)
BUN: 8 mg/dL (ref 6–20)
CALCIUM: 8.9 mg/dL (ref 8.9–10.3)
CO2: 25 mmol/L (ref 22–32)
Chloride: 104 mmol/L (ref 101–111)
Creatinine, Ser: 1.13 mg/dL (ref 0.61–1.24)
Glucose, Bld: 77 mg/dL (ref 65–99)
Potassium: 3.8 mmol/L (ref 3.5–5.1)
SODIUM: 139 mmol/L (ref 135–145)
TOTAL PROTEIN: 5.6 g/dL — AB (ref 6.5–8.1)

## 2015-06-21 LAB — CBC
HCT: 39.8 % (ref 39.0–52.0)
Hemoglobin: 13.3 g/dL (ref 13.0–17.0)
MCH: 27.8 pg (ref 26.0–34.0)
MCHC: 33.4 g/dL (ref 30.0–36.0)
MCV: 83.3 fL (ref 78.0–100.0)
Platelets: 259 10*3/uL (ref 150–400)
RBC: 4.78 MIL/uL (ref 4.22–5.81)
RDW: 13.4 % (ref 11.5–15.5)
WBC: 11 10*3/uL — ABNORMAL HIGH (ref 4.0–10.5)

## 2015-06-21 LAB — RAPID URINE DRUG SCREEN, HOSP PERFORMED
AMPHETAMINES: NOT DETECTED
Barbiturates: NOT DETECTED
Benzodiazepines: NOT DETECTED
Cocaine: POSITIVE — AB
OPIATES: NOT DETECTED
TETRAHYDROCANNABINOL: NOT DETECTED

## 2015-06-21 LAB — ACETAMINOPHEN LEVEL

## 2015-06-21 LAB — ETHANOL

## 2015-06-21 LAB — SALICYLATE LEVEL

## 2015-06-21 MED ORDER — TRAZODONE HCL 50 MG PO TABS
50.0000 mg | ORAL_TABLET | Freq: Every evening | ORAL | Status: DC | PRN
  Administered 2015-06-21: 50 mg via ORAL
  Filled 2015-06-21: qty 1

## 2015-06-21 MED ORDER — ONDANSETRON HCL 4 MG PO TABS: 4.0000 mg | ORAL_TABLET | Freq: Three times a day (TID) | ORAL | Status: DC | PRN

## 2015-06-21 MED ORDER — NICOTINE POLACRILEX 2 MG MT GUM
2.0000 mg | CHEWING_GUM | OROMUCOSAL | Status: DC | PRN
  Administered 2015-06-21 – 2015-06-22 (×3): 2 mg via ORAL
  Filled 2015-06-21 (×2): qty 1

## 2015-06-21 MED ORDER — NICOTINE 21 MG/24HR TD PT24
21.0000 mg | MEDICATED_PATCH | Freq: Every day | TRANSDERMAL | Status: DC
  Administered 2015-06-21: 21 mg via TRANSDERMAL
  Filled 2015-06-21: qty 1

## 2015-06-21 MED ORDER — IBUPROFEN 400 MG PO TABS: 600.0000 mg | ORAL_TABLET | Freq: Three times a day (TID) | ORAL | Status: DC | PRN

## 2015-06-21 MED ORDER — ACETAMINOPHEN 325 MG PO TABS: 650.0000 mg | ORAL_TABLET | Freq: Four times a day (QID) | ORAL | Status: DC | PRN

## 2015-06-21 MED ORDER — LORAZEPAM 1 MG PO TABS: 1.0000 mg | ORAL_TABLET | Freq: Three times a day (TID) | ORAL | Status: DC | PRN

## 2015-06-21 MED ORDER — ACETAMINOPHEN 325 MG PO TABS
650.0000 mg | ORAL_TABLET | ORAL | Status: DC | PRN
  Administered 2015-06-21: 650 mg via ORAL
  Filled 2015-06-21: qty 2

## 2015-06-21 NOTE — ED Notes (Signed)
TTS machine at bedside - counselor evaluating pt at this time.
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
## 2015-06-21 NOTE — ED Notes (Signed)
Pt belongings inventoried. Pt changed into maroon scrubs.

## 2015-06-21 NOTE — ED Provider Notes (Addendum)
CSN: [id number]     Arrival date & time 06/21/15  4098 History   First MD Initiated Contact with Patient 06/21/15 [phone]     Chief Complaint  Patient presents with  . Suicidal     (Consider location/radiation/quality/duration/timing/severity/associated sxs/prior Treatment   HPI patient reports feeling suicidal for several days. He states "I'm tired of being homeless" he's been smoking crack to help treat his depression. Last time used crack was earlier today. He states that he would try to find somebody with a gun in order to kill himself. He does report history of prior suicide attempt. Hasn't eaten in 3 days. No other associated symptoms. No treatment prior to coming here. Nothing makes symptoms better or worse Past Medical History  Diagnosis Date  . Hypertension   . Hypercholesteremia   . Sleep apnea   . Bipolar 1 disorder (HCC)   . Depression   . Homelessness    History reviewed. No pertinent past surgical history. No family history on file. Social History  Substance Use Topics  . Smoking status: Current Every Day Smoker    Types: Cigarettes  . Smokeless tobacco: None  . Alcohol Use: Yes    last time used alcohol several months ago. Denies IV drug use Review of Systems  Constitutional: Negative.   HENT: Negative.   Respiratory: Negative.   Cardiovascular: Negative.   Gastrointestinal: Negative.   Musculoskeletal: Negative.   Skin: Negative.   Neurological: Negative.   Psychiatric/Behavioral: Positive for suicidal ideas.  All other systems reviewed and are negative.     Allergies  Review of patient's allergies indicates no known allergies.  Home Medications   Prior to Admission medications   Medication Sig Start Date End Date Taking? Authorizing Provider  benzonatate (TESSALON) 100 MG capsule Take 1 capsule (100 mg total) by mouth every 8 (eight) hours. 05/31/15   [name], PA-C  cephALEXin (KEFLEX) 500 MG capsule Take 1 capsule (500 mg total) by mouth 3  (three) times daily. Patient not taking: Reported on 05/14/2015 05/10/15   Doug Sou, MD  hydrOXYzine (ATARAX/VISTARIL) 50 MG tablet Take 1 tablet (50 mg total) by mouth 3 (three) times daily as needed for anxiety (order is for x 1 NOW). 04/03/15   Adonis Brook, NP   BP 137/88 mmHg  Pulse 82  Temp(Src) 98.1 F (36.7 C) (Oral)  Resp 18  SpO2 100% Physical Exam  Constitutional: He appears well-developed and well-nourished.  HENT:  Head: Normocephalic and atraumatic.  Eyes: Conjunctivae are normal. Pupils are equal, round, and reactive to light.  Neck: Neck supple. No tracheal deviation present. No thyromegaly present.  Cardiovascular: Normal rate and regular rhythm.   No murmur heard. Pulmonary/Chest: Effort normal and breath sounds normal.  Abdominal: Soft. Bowel sounds are normal. He exhibits no distension. There is no tenderness.  Musculoskeletal: Normal range of motion. He exhibits no edema or tenderness.  Neurological: He is alert. No cranial nerve deficit. Coordination normal.  Gait normal  Skin: Skin is warm and dry. No rash noted.  Psychiatric:  Depressed affect  Nursing note and vitals reviewed.   ED Course  Procedures (including critical care time) Labs Review Labs Reviewed - No data to display  Imaging Review No results found. I have personally reviewed and evaluated these images and lab results as part of my medical decision-making.   EKG Interpretation None     Results for orders placed or performed during the hospital encounter of 06/21/15  Comprehensive metabolic panel  Result Value Ref  Range   Sodium 139 135 - 145 mmol/L   Potassium 3.8 3.5 - 5.1 mmol/L   Chloride 104 101 - 111 mmol/L   CO2 25 22 - 32 mmol/L   Glucose, Bld 77 65 - 99 mg/dL   BUN 8 6 - 20 mg/dL   Creatinine, Ser 1.61 0.61 - 1.24 mg/dL   Calcium 8.9 8.9 - 09.6 mg/dL   Total Protein 5.6 (L) 6.5 - 8.1 g/dL   Albumin 3.3 (L) 3.5 - 5.0 g/dL   AST 33 15 - 41 U/L   ALT 21 17 - 63  U/L   Alkaline Phosphatase 52 38 - 126 U/L   Total Bilirubin 0.8 0.3 - 1.2 mg/dL   GFR calc non Af Amer >60 >60 mL/min   GFR calc Af Amer >60 >60 mL/min   Anion gap 10 5 - 15  Ethanol (ETOH)  Result Value Ref Range   Alcohol, Ethyl (B) <5 <5 mg/dL  Salicylate level  Result Value Ref Range   Salicylate Lvl <4.0 2.8 - 30.0 mg/dL  Acetaminophen level  Result Value Ref Range   Acetaminophen (Tylenol), Serum <10 (L) 10 - 30 ug/mL  CBC  Result Value Ref Range   WBC 11.0 (H) 4.0 - 10.5 K/uL   RBC 4.78 4.22 - 5.81 MIL/uL   Hemoglobin 13.3 13.0 - 17.0 g/dL   HCT 04.5 40.9 - 81.1 %   MCV 83.3 78.0 - 100.0 fL   MCH 27.8 26.0 - 34.0 pg   MCHC 33.4 30.0 - 36.0 g/dL   RDW 91.4 78.2 - 95.6 %   Platelets 259 150 - 400 K/uL  Urine rapid drug screen (hosp performed) (Not at [facility])  Result Value Ref Range   Opiates NONE DETECTED NONE DETECTED   Cocaine POSITIVE (A) NONE DETECTED   Benzodiazepines NONE DETECTED NONE DETECTED   Amphetamines NONE DETECTED NONE DETECTED   Tetrahydrocannabinol NONE DETECTED NONE DETECTED   Barbiturates NONE DETECTED NONE DETECTED   Dg Chest 2 View  05/31/2015  CLINICAL DATA:  Cough and congestion with weakness 3 weeks. EXAM: CHEST  2 VIEW COMPARISON:  05/14/2015 and 04/01/2015 FINDINGS: Lungs are adequately inflated without consolidation or effusion. Cardiomediastinal silhouette is within normal. Mild curvature of the thoracic spine convex right as well as mild degenerative change of the spine. IMPRESSION: No active cardiopulmonary disease. Electronically Signed   By: Elberta Fortis M.D.   On: 05/31/2015 07:09    MDM  Patient is felt to be suicide risk. I've consulted TTS to assess for psychiatric inpatient bed.pt agrees to inpatient psychiatric stay Final diagnoses:  None   diagnosis #1 suicidal ideation #2 homelessness #3 cocaine abuse      Doug Sou, MD 06/21/15 2130  Doug Sou, MD 06/21/15 [id number]

## 2015-06-21 NOTE — ED Notes (Signed)
Carolyn Stare called checking on pt. Advised her pt is aware she called and chose not to call her back at that time. Advised her will notify pt when he awakens again.

## 2015-06-21 NOTE — Progress Notes (Signed)
Nursing Admission Note:  Patient is a Voluntary Admission from [facility] with diagnosis of Bipolar who presented to the ED with complaints ofSI with a plan to shoot himself. Patient is homeless and a a regular crack user who has spent all of his money on crack which he has been using approximately 20 years.  Patient is very weepy during the Admission Assessment, saying he wishes he could "just die" and that "I've got to get myself together" and desires placement in a long term facility for substance abuse. He states he has one friend, a male, whom he states "and I'm fixing to lose her too". "I just need to change my life and stay off of that stuff; i don't have anything left".

## 2015-06-21 NOTE — ED Notes (Signed)
Pt and pt belongings wanded by security.

## 2015-06-21 NOTE — Progress Notes (Signed)
Patient did not attend the evening speaker AA meeting. Pt was notified that group was beginning and returned to his room.    

## 2015-06-21 NOTE — ED Notes (Addendum)
Pt ambulatory to nurses' desk attempting to return Henry Hicks's call. Pt signed consent forms - copy faxed to [facility], copy sent to medical records and original placed in folder for [facility].

## 2015-06-21 NOTE — ED Notes (Addendum)
Pt reports "I am suicidal because I am tired of being homeless". Has hx of bipolar, depression, SI. Reports plan of trying to get a gun and "blow his brains out" or get a knife and cut himself. Also used crack cocaine for the last 3 days. No ETOH.

## 2015-06-21 NOTE — ED Notes (Signed)
Pt asked RN to advise Mahala Menghini of him being transported to [facility] if she calls back prior to him contacting her.

## 2015-06-21 NOTE — ED Notes (Signed)
Contacted staffing for sitter  

## 2015-06-21 NOTE — Tx Team (Addendum)
Initial Interdisciplinary Treatment Plan   PATIENT STRESSORS: Financial difficulties Occupational concerns Substance abuse   PATIENT STRENGTHS: Average or above average intelligence Capable of independent living Communication skills Motivation for treatment/growth Physical Health   PROBLEM LIST: Problem List/Patient Goals Date to be addressed Date deferred Reason deferred Estimated date of resolution  Depression 06/21/2015     Suicidal Ideation 06/21/2015     Substance Abuse 06/21/2015     Homelessness 06/21/2015           "I need to change my life and stay off that stuff"                         DISCHARGE CRITERIA:  Adequate post-discharge living arrangements Improved stabilization in mood, thinking, and/or behavior Motivation to continue treatment in a less acute level of care Need for constant or close observation no longer present Withdrawal symptoms are absent or subacute and managed without 24-hour nursing intervention  PRELIMINARY DISCHARGE PLAN: Attend 12-step recovery group Outpatient therapy Placement in alternative living arrangements  PATIENT/FAMIILY INVOLVEMENT: This treatment plan has been presented to and reviewed with the patient, Henry Hicks.  The patient and family have been given the opportunity to ask questions and make suggestions.  [place]ranford Mon 06/21/2015, 6:16 PM

## 2015-06-21 NOTE — ED Notes (Signed)
Pt resting on bed quietly w/eyes closed. Respirations even, unlabored. Breakfast tray on bedside table. Pt aware Henry Hicks called requesting to speak w/him - advised he will call her back after he rests.

## 2015-06-21 NOTE — ED Notes (Signed)
Pt ambulatory to C21 w/sitter and RN. Pt's belongings x 1 labeled belongings bag and 1 valuables placed at nurses' desk - inventoried. Pt noted to be wearing earring in left ear. Pt appears sleepy.

## 2015-06-21 NOTE — ED Notes (Signed)
Pt awake and is eating breakfast. Requesting med for headache.

## 2015-06-21 NOTE — Progress Notes (Signed)
Patient complained about his room been so cold stated that his room mate want it that way. Room temperature was at 65 C. Both patient was made aware that room temperature should be normal. Was set at 70 C. Both agreed. Patient requested for a sleeping pill and Nicotine gum. Trazodone 50 mg and Nicotine gum offered. No further complaint.  Support and encouragement offered to patient. Safety maintained. Will continue to monitor patient for safety and stability.

## 2015-06-21 NOTE — Progress Notes (Signed)
Disposition CSW completed patient referrals to the following inpatient psych facilities:  [facility]  CSW will continue to follow patient for placement needs.  [facility] Disposition CSW [phone]

## 2015-06-22 DIAGNOSIS — R45851 Suicidal ideations: Secondary | ICD-10-CM

## 2015-06-22 DIAGNOSIS — F339 Major depressive disorder, recurrent, unspecified: Secondary | ICD-10-CM

## 2015-06-22 LAB — LIPID PANEL
CHOLESTEROL: 162 mg/dL (ref 0–200)
HDL: 49 mg/dL (ref >40)
LDL CALC: 106 mg/dL — AB (ref 0–99)
TRIGLYCERIDES: 36 mg/dL (ref <150)
Total CHOL/HDL Ratio: 3.3 RATIO
VLDL: 7 mg/dL (ref 0–40)

## 2015-06-22 MED ORDER — ALUM & MAG HYDROXIDE-SIMETH 200-200-20 MG/5ML PO SUSP: 30.0000 mL | Freq: Four times a day (QID) | ORAL | Status: DC | PRN

## 2015-06-22 MED ORDER — TRAZODONE HCL 100 MG PO TABS
100.0000 mg | ORAL_TABLET | Freq: Every day | ORAL | Status: DC
  Administered 2015-06-22 – 2015-06-24 (×3): 100 mg via ORAL
  Filled 2015-06-22: qty 1
  Filled 2015-06-22: qty 7
  Filled 2015-06-22 (×3): qty 1

## 2015-06-22 MED ORDER — POTASSIUM CHLORIDE CRYS ER 20 MEQ PO TBCR
20.0000 meq | EXTENDED_RELEASE_TABLET | Freq: Two times a day (BID) | ORAL | Status: AC
  Administered 2015-06-22 – 2015-06-25 (×6): 20 meq via ORAL
  Filled 2015-06-22 (×8): qty 1

## 2015-06-22 MED ORDER — FLUOXETINE HCL 20 MG PO CAPS
20.0000 mg | ORAL_CAPSULE | Freq: Every day | ORAL | Status: DC
  Administered 2015-06-22 – 2015-06-23 (×2): 20 mg via ORAL
  Filled 2015-06-22 (×6): qty 1

## 2015-06-22 MED ORDER — HYDROXYZINE HCL 25 MG PO TABS
25.0000 mg | ORAL_TABLET | Freq: Four times a day (QID) | ORAL | Status: DC | PRN
  Administered 2015-06-25: 25 mg via ORAL
  Filled 2015-06-22: qty 1
  Filled 2015-06-22: qty 10

## 2015-06-22 NOTE — BHH Group Notes (Signed)
[facility] Group Notes:  Healthy coping skills  Date:  06/22/2015  Time:  1300  Type of Therapy:  Nurse Education  Participation Level:  Did Not Attend  Participation Quality:  Inattentive  Affect:  Flat  Cognitive:  Lacking  Insight:  None  Engagement in Group:  None  Modes of Intervention:  Discussion  Summary of Progress/Problems:Pt did not attend group  Rodman Key [facility] 06/22/2015, 2:23 PM

## 2015-06-22 NOTE — BHH Suicide Risk Assessment (Signed)
[facility] Admission Suicide Risk Assessment   Nursing information obtained from:  Patient Demographic factors:  Male Current Mental Status:  Suicidal ideation indicated by patient Loss Factors:  Decrease in vocational status, Loss of significant relationship, Decline in physical health, Financial problems / change in socioeconomic status Historical Factors:  Prior suicide attempts Risk Reduction Factors:  Positive social support  Total Time spent with patient: 45 minutes Principal Problem: Major depressive disorder, recurrent, unspecified (HCC) Diagnosis:   Patient Active Problem List   Diagnosis Date Noted  . Bipolar 1 disorder, depressed, severe (HCC) [F31.4] 06/21/2015  . Major depressive disorder, recurrent, unspecified (HCC) [F33.9] 04/02/2015   Subjective Data: Patient is 45 year old African-American, unemployed man who is admitted to the [facility] due to severe depression and having suicidal thoughts with plan to shoot himself.  Patient is homeless for past 2 months after he lost his place.  He is very tearful, feeling fatigue and hopeless.  He mentioned that last time he tried Remeron but it did not help.  His UDS is positive for cocaine.  Patient requires inpatient treatment.  Continued Clinical Symptoms:  Alcohol Use Disorder Identification Test Final Score (AUDIT): 1 The "Alcohol Use Disorders Identification Test", Guidelines for Use in Primary Care, Second Edition.  World Science writer [facility]). Score between 0-7:  no or low risk or alcohol related problems. Score between 8-15:  moderate risk of alcohol related problems. Score between 16-19:  high risk of alcohol related problems. Score 20 or above:  warrants further diagnostic evaluation for alcohol dependence and treatment.   CLINICAL FACTORS:   Depression:   Anhedonia Hopelessness Impulsivity Insomnia Recent sense of peace/wellbeing Severe Alcohol/Substance Abuse/Dependencies More than one psychiatric  diagnosis Unstable or Poor Therapeutic Relationship Previous Psychiatric Diagnoses and Treatments   Musculoskeletal: Strength & Muscle Tone: within normal limits Gait & Station: normal Patient leans: N/A  Psychiatric Specialty Exam: ROS  Blood pressure 114/73, pulse 92, temperature 98.6 F (37 C), temperature source Oral, resp. rate 18, height  (1.702 m), weight 83.915 kg (185 lb), SpO2 100 %.Body mass index is 28.97 kg/(m^2).  General Appearance: Disheveled and Fairly Groomed  Patent attorney::  Fair  Speech:  Slow  Volume:  Decreased  Mood:  Depressed, Dysphoric, Hopeless and Irritable  Affect:  Constricted and Depressed  Thought Process:  Goal Directed  Orientation:  Full (Time, Place, and Person)  Thought Content:  Rumination  Suicidal Thoughts:  Yes.  with intent/plan  Homicidal Thoughts:  No  Memory:  Immediate;   Fair Recent;   Fair Remote;   Fair  Judgement:  Impaired  Insight:  Shallow  Psychomotor Activity:  Decreased  Concentration:  Fair  Recall:  Fair  Fund of Knowledge:Good  Language: Good  Akathisia:  No  Handed:  Right  AIMS (if indicated):     Assets:  Communication Skills Desire for Improvement  Sleep:  Number of Hours: 5.25  Cognition: WNL  ADL's:  Intact    COGNITIVE FEATURES THAT CONTRIBUTE TO RISK:  Closed-mindedness, Loss of executive function and Polarized thinking    SUICIDE RISK:   Moderate:  Frequent suicidal ideation with limited intensity, and duration, some specificity in terms of plans, no associated intent, good self-control, limited dysphoria/symptomatology, some risk factors present, and identifiable protective factors, including available and accessible social support.  PLAN OF CARE: Patient is 45 year old African-American man who was admitted due to severe depression and having suicidal thoughts and plan to kill himself.  He is noncompliant with medication.  He admitted smoking cocaine.  His UDS is positive for cocaine.  He  requires inpatient treatment for stabilization.  Please see history physical and complete treatment plan for more details.  I certify that inpatient services furnished can reasonably be expected to improve the patient's condition.   Monnie Anspach T., MD 06/22/2015, 12:24 PM

## 2015-06-22 NOTE — Progress Notes (Signed)
Nursing Shift Assessment:  Patient very lethargic, wants to stay in bed, does get up for meals.Did not attend SW Group this am but did meet with Provider. No new orders as of yet. Patient mostly isolative in his room in bed, but is cooperative when interacting with peers and staff. Patient rating his depression today on Self Inventory sheet as a "6", hopelessness at "a 6 or 7" but denies any SI. Patient also rating anxiety at a "5". Patient denies HI or AVH, and is reporting no SI but agrees to tell staff should he develop any such Ideation or plan to harm self. Patient's mood and affect notably brighter than on admission one day previous.
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
## 2015-06-22 NOTE — B[facility] Group Notes (Signed)
[facility] Group Notes: (Clinical Social Work)   06/22/2015      Type of Therapy:  Group Therapy   Participation Level:  Did Not Attend despite MHT prompting   Yedidya Duddy Grossman-Orr, LCSW 06/22/2015, 12:25 PM     

## 2015-06-23 DIAGNOSIS — F1410 Cocaine abuse, uncomplicated: Secondary | ICD-10-CM | POA: Diagnosis present

## 2015-06-23 LAB — HEMOGLOBIN A1C
Hgb A1c MFr Bld: 5.6 % (ref 4.8–5.6)
Mean Plasma Glucose: 114 mg/dL

## 2015-06-23 MED ORDER — IBUPROFEN 600 MG PO TABS: 600.0000 mg | ORAL_TABLET | ORAL | Status: DC | PRN

## 2015-06-23 MED ORDER — PANTOPRAZOLE SODIUM 40 MG PO TBEC
40.0000 mg | DELAYED_RELEASE_TABLET | Freq: Every day | ORAL | Status: DC
  Administered 2015-06-23 – 2015-06-25 (×3): 40 mg via ORAL
  Filled 2015-06-23 (×7): qty 1

## 2015-06-23 MED ORDER — BUPROPION HCL ER (XL) 150 MG PO TB24
150.0000 mg | ORAL_TABLET | Freq: Every day | ORAL | Status: DC
  Administered 2015-06-24 – 2015-06-25 (×2): 150 mg via ORAL
  Filled 2015-06-23: qty 7
  Filled 2015-06-23 (×4): qty 1

## 2015-06-23 MED ORDER — NICOTINE 21 MG/24HR TD PT24
21.0000 mg | MEDICATED_PATCH | Freq: Every day | TRANSDERMAL | Status: DC
  Administered 2015-06-23 – 2015-06-25 (×3): 21 mg via TRANSDERMAL
  Filled 2015-06-23 (×4): qty 1

## 2015-06-23 NOTE — BHH Group Notes (Signed)
Adult Psychoeducational Group Note  Date:  06/23/2015 Time:  9:52 PM  Group Topic/Focus:  AA Meeting  Participation Level:  Did Not Attend  Participation Quality:  None  Affect:  None  Cognitive:  None  Insight: None  Engagement in Group:  None  Modes of Intervention:  Discussion and Education  Additional Comments:  Pt did not attend group.  Caroll Rancher A 06/23/2015, 9:52 PM

## 2015-06-23 NOTE — Progress Notes (Signed)
Recreation Therapy Notes  Date: 02.06.2017 Time: 9:30am Location: [address] Group Room   Group Topic: Stress Management  Goal Area(s) Addresses:  Patient will actively participate in stress management techniques presented during session.   Behavioral Response: Did not attend.   Goldie Dimmer L Jaxon Flatt, LRT/CTRS        Dylon Correa L 06/23/2015 3:27 PM 

## 2015-06-23 NOTE — Progress Notes (Signed)
[facility] MD Progress Note  06/23/2015 2:53 PM Henry Hicks  MRN:  [id number] Subjective:  Henry Hicks states he wants help. At the same time he is homeless and needs to get a job in order to be able to get a place. He is concerned about sexual side effects from the Prozac. He is diagnosed with sleep apnea . States he has not used a CPAP machine in years.  Does not think he needs it Principal Problem: Major depressive disorder, recurrent, unspecified (HCC) Diagnosis:   Patient Active Problem List   Diagnosis Date Noted  . Bipolar 1 disorder, depressed, severe (HCC) [F31.4] 06/21/2015  . Major depressive disorder, recurrent, unspecified (HCC) [F33.9] 04/02/2015   Total Time spent with patient: 20 minutes  Past Psychiatric History: see admission H and P  Past Medical History:  Past Medical History  Diagnosis Date  . Hypertension   . Hypercholesteremia   . Sleep apnea   . Bipolar 1 disorder (HCC)   . Depression   . Homelessness    History reviewed. No pertinent past surgical history. Family History: History reviewed. No pertinent family history. Family Psychiatric  History: see admission H and P Social History:  History  Alcohol Use  . Yes     History  Drug Use  . Yes  . Special: Cocaine, Marijuana    Social History   Social History  . Marital Status: Single    Spouse Name: N/A  . Number of Children: N/A  . Years of Education: N/A   Social History Main Topics  . Smoking status: Current Every Day Smoker    Types: Cigarettes  . Smokeless tobacco: None  . Alcohol Use: Yes  . Drug Use: Yes    Special: Cocaine, Marijuana  . Sexual Activity: Not Asked   Other Topics Concern  . None   Social History Narrative   Additional Social History:                         Sleep: Fair  Appetite:  Fair  Current Medications: Current Facility-Administered Medications  Medication Dose Route Frequency Provider Last Rate Last Dose  . acetaminophen (TYLENOL) tablet 650 mg  650  mg Oral Q6H PRN Kristeen Mans, NP      . alum & mag hydroxide-simeth (MAALOX/MYLANTA) 200-200-20 MG/5ML suspension 30 mL  30 mL Oral Q6H PRN Worthy Flank, NP      . FLUoxetine (PROZAC) capsule 20 mg  20 mg Oral Daily Sanjuana Kava, NP   20 mg at 06/23/15 4782  . hydrOXYzine (ATARAX/VISTARIL) tablet 25 mg  25 mg Oral Q6H PRN Sanjuana Kava, NP      . nicotine (NICODERM CQ - dosed in mg/24 hours) patch 21 mg  21 mg Transdermal Daily Rachael Fee, MD   21 mg at 06/23/15 1257  . potassium chloride SA (K-DUR,KLOR-CON) CR tablet 20 mEq  20 mEq Oral BID Sanjuana Kava, NP   20 mEq at 06/23/15 0826  . traZODone (DESYREL) tablet 100 mg  100 mg Oral QHS Sanjuana Kava, NP   100 mg at 06/22/15 2245    Lab Results:  Results for orders placed or performed during the hospital encounter of 06/21/15 (from the past 48 hour(s))  Hemoglobin A1c     Status: None   Collection Time: 06/22/15  6:51 AM  Result Value Ref Range   Hgb A1c MFr Bld 5.6 4.8 - 5.6 %    Comment: (NOTE)  Pre-diabetes: 5.7 - 6.4         Diabetes: >6.4         Glycemic control for adults with diabetes: <7.0    Mean Plasma Glucose 114 mg/dL    Comment: (NOTE) Performed At: [facility] [address] [place], [place] [id number] Mila Homer MD WU:[id number] Performed at [facility]   Lipid panel, fasting     Status: Abnormal   Collection Time: 06/22/15  6:51 AM  Result Value Ref Range   Cholesterol 162 0 - 200 mg/dL   Triglycerides 36 <956 mg/dL   HDL 49 >21 mg/dL   Total CHOL/HDL Ratio 3.3 RATIO   VLDL 7 0 - 40 mg/dL   LDL Cholesterol 308 (H) 0 - 99 mg/dL    Comment:        Total Cholesterol/HDL:CHD Risk Coronary Heart Disease Risk Table                     Men   Women  1/2 Average Risk   3.4   3.3  Average Risk       5.0   4.4  2 X Average Risk   9.6   7.1  3 X Average Risk  23.4   11.0        Use the calculated Patient Ratio above and the CHD Risk Table to determine the patient's  CHD Risk.        ATP III CLASSIFICATION (LDL):  <100     mg/dL   Optimal  657-846  mg/dL   Near or Above                    Optimal  130-159  mg/dL   Borderline  962-952  mg/dL   High  >841     mg/dL   Very High Performed at [facility]     Physical Findings: AIMS: Facial and Oral Movements Muscles of Facial Expression: None, normal Lips and Perioral Area: None, normal Jaw: None, normal Tongue: None, normal,Extremity Movements Upper (arms, wrists, hands, fingers): None, normal Lower (legs, knees, ankles, toes): None, normal, Trunk Movements Neck, shoulders, hips: None, normal, Overall Severity Severity of abnormal movements (highest score from questions above): None, normal Incapacitation due to abnormal movements: None, normal Patient's awareness of abnormal movements (rate only patient's report): No Awareness, Dental Status Current problems with teeth and/or dentures?: No Does patient usually wear dentures?: No  CIWA:  CIWA-Ar Total: 3 COWS:  COWS Total Score: 1  Musculoskeletal: Strength & Muscle Tone: within normal limits Gait & Station: normal Patient leans: normal  Psychiatric Specialty Exam: Review of Systems  Constitutional: Positive for malaise/fatigue.  HENT:       Pressure unilateral  Eyes: Positive for blurred vision.  Respiratory: Positive for cough and shortness of breath.        Pack and a half a day  Cardiovascular: Negative.   Gastrointestinal: Positive for heartburn, nausea and diarrhea.  Genitourinary: Negative.   Musculoskeletal: Positive for back pain and neck pain.  Skin: Negative.   Neurological: Positive for dizziness, weakness and headaches.  Endo/Heme/Allergies: Negative.   Psychiatric/Behavioral: Positive for depression and substance abuse. The patient has insomnia.     Blood pressure 123/84, pulse 93, temperature 98.6 F (37 C), temperature source Oral, resp. rate 16, height 5\' 7"  (1.702 m), weight 83.915 kg (185 lb), SpO2 100  %.Body mass index is 28.97 kg/(m^2).  General Appearance: Fairly Groomed  Patent attorney::  Fair  Speech:  Clear and Coherent  Volume:  Decreased  Mood:  Anxious and Depressed  Affect:  Restricted  Thought Process:  Coherent and Goal Directed  Orientation:  Full (Time, Place, and Person)  Thought Content:  symptoms events worries concerns  Suicidal Thoughts:  No  Homicidal Thoughts:  No  Memory:  Immediate;   Fair Recent;   Fair Remote;   Fair  Judgement:  Fair  Insight:  Present and Shallow  Psychomotor Activity:  Restlessness  Concentration:  Fair  Recall:  [employer] of Knowledge:Fair  Language: Fair  Akathisia:  No  Handed:  Right  AIMS (if indicated):     Assets:  Desire for Improvement  ADL's:  Intact  Cognition: WNL  Sleep:  Number of Hours: 5.75   Treatment Plan Summary: Daily contact with patient to assess and evaluate symptoms and progress in treatment and Medication management Supportive approach/coping skills Cocaine abuse-dependence; work a relapse prevention plan Depression; will D/C the Prozac and try Wellbutrin XL 150 mg in AM (more activating might help with the day drowsiness from sleep apnea) and has less of a chance of producing sexual side effects Will explore residential treatment options Pierre Cumpton A, MD 06/23/2015, 2:53 PM

## 2015-06-23 NOTE — Tx Team (Addendum)
Interdisciplinary Treatment Plan Update (Adult)  Date:  06/23/2015  Time Reviewed:  11:37 AM   Progress in Treatment: Attending groups: Yes. Participating in groups:  Yes. Taking medication as prescribed:  Yes. Tolerating medication:  Yes. Family/Significant othe contact made:  Yes, CSW has spoken with friend   Patient understands diagnosis:  Yes. and As evidenced by:  seeking treatment for SI, AH, depression, crack cocaine abuse. Discussing patient identified problems/goals with staff:  Yes. Medical problems stabilized or resolved:  Yes. Denies suicidal/homicidal ideation: Yes, denies Issues/concerns per patient self-inventory:  Other:  Discharge Plan or Barriers: Patient provided with information on [facility], and has a referral pending at [facility]. He will return to previous living situation to follow up with outpatient services.   Reason for Continuation of Hospitalization: Depression Hallucinations Medication stabilization Suicidal ideation Withdrawal symptoms  Comments:  Henry Hicks is an 44 y.o. male. Pt presents voluntarily to [facility]. He is cooperative and oriented x 4. Pt is drowsy and falls asleep several times during assessment. He endorses SI with plan. Pt says, "I will shoot myself, cut myself." Pt reports two prior suicide attempts. He says he is homeless and living on the street. He endorses a "sad, lonely" mood. Pt reports fatigue, insomnia (hasn't slept in 3 days), poor appetite (has lost 50 lbs in 4 mos), hopelessness, tearfulness, and guilt. Pt denies HI and no delusions noted. He says he moved from [place] 8 mos ago b/c he had a friend in [place]. Pt says he is suicidal. Pt says, "I'm just tired of being homeless. I'm hopeless." Pt reports he has used crack cocaine daily for the past 3 days. Pt says he has been going to [place] but isn't compliant with his meds b/c he doesn't think they are effective. He endorses AH. He says he hear a voice which says he is  better off dead. Diagnosis: Bipolar Disorder  Estimated length of stay:  Discharge anticipated for 06/25/15 days   New goal(s): to develop effective aftercare plan.   Additional Comments:  Patient and CSW reviewed pt's identified goals and treatment plan. Patient verbalized understanding and agreed to treatment plan. CSW reviewed [facility] "Discharge Process and Patient Involvement" Form. Pt verbalized understanding of information provided and signed form.    Review of initial/current patient goals per problem list:  1. Goal(s): Patient will participate in aftercare plan  Met: Yes  Target date: at discharge  As evidenced by: Patient will participate within aftercare plan AEB aftercare provider and housing plan at discharge being identified.  2/6: Pt requesting [facility] referral. CSW assessing.  2/8: Goal met. Patient provided with information on [facility], and has a referral pending at [facility]. He will return to previous living situation to follow up with outpatient services.  2. Goal (s): Patient will exhibit decreased depressive symptoms and suicidal ideations.  Met: Adequate for discharge per MD.   Target date: at discharge  As evidenced by: Patient will utilize self rating of depression at 3 or below and demonstrate decreased signs of depression or be deemed stable for discharge by MD.  2/6: Pt rates depression as 5-6 and denies SI/HI/AVH this morning.  2/8: Adequate for discharge. Patient reports baseline depression level and reports feeling safe for discharge.   3. Goal(s): Patient will demonstrate decreased signs of withdrawal due to substance abuse  Met:Yes  Target date:at discharge   As evidenced by: Patient will produce a CIWA/COWS score of 0, have stable vitals signs, and no symptoms of withdrawal.  2/6: Pt reports minimal withdrawals with CIWA score of 3 and stable vitals. 2/8: Goal met. No withdrawal symptoms reported at this time per medical chart.    Attendees: Patient:   06/23/2015 11:37 AM   Family:   06/23/2015 11:37 AM   Physician:  Dr. Carlton Adam, MD 06/23/2015 11:37 AM   Nursing:   Chestine Spore RN 06/23/2015 11:37 AM   Clinical Social Worker: Maxie Better, LCSW 06/23/2015 11:37 AM   Clinical Social Worker: Peri Maris [place] 06/23/2015 11:37 AM   Other:  Gerline Legacy Nurse Case Manager 06/23/2015 11:37 AM   Other:   06/23/2015 11:37 AM   Other:   06/23/2015 11:37 AM   Other:  06/23/2015 11:37 AM   Other:  06/23/2015 11:37 AM   Other:  06/23/2015 11:37 AM    06/23/2015 11:37 AM    06/23/2015 11:37 AM    06/23/2015 11:37 AM    06/23/2015 11:37 AM    Scribe for Treatment Team:   Maxie Better, LCSW 06/23/2015 11:37 AM

## 2015-06-23 NOTE — [facility] Group Notes (Signed)
[facility] LCSW Aftercare Discharge Planning Group Note   06/23/2015 11:36 AM  Participation Quality:  Appropriate   Mood/Affect:  Anxious  Depression Rating:  5-6  Anxiety Rating:  5-6  Thoughts of Suicide:  No Will you contract for safety?   NA  Current AVH:  No  Plan for Discharge/Comments:  Pt reports that he has been homeless for the past 2 months. He is requesting [facility] referral. Pt reports no withdrawals and good sleep. Anxious affect during group/somewhat confused.   Transportation Means: unknown at this time.    Supports: Pt did not identify any supports at this time.   Smart, Sapphira Harjo LCSW

## 2015-06-23 NOTE — [facility] Group Notes (Signed)
[facility] LCSW Group Therapy 06/23/2015  1:15 pm   Type of Therapy: Group Therapy Participation Level: Active  Participation Quality: Attentive, Sharing and Supportive  Affect: Blunted  Cognitive: Alert and Oriented  Insight: Developing/Improving and Engaged  Engagement in Therapy: Developing/Improving and Engaged  Modes of Intervention: Clarification, Confrontation, Discussion, Education, Exploration, Limit-setting, Orientation, Problem-solving, Rapport Building, Dance movement psychotherapist, Socialization and Support  Summary of Progress/Problems: The topic for group was balance in life. Today's group focused on defining balance in one's own words, identifying things that can knock one off balance, and exploring healthy ways to maintain balance in life. Group members were asked to provide an example of a time when they felt off balance, describe how they handled that situation,and process healthier ways to regain balance in the future. Group members were asked to share the most important tool for maintaining balance that they learned while at [facility] and how they plan to apply this method after discharge. Patient identified homelessness and addiction as ways in which his life is out of balance. He states that he recently started a new job and feels confident that medication compliance will help him to obtain more balance. CSW and other group members provided patient with emotional support and encouragement.  Samuella Bruin, MSW, LCSW Clinical Social Worker [facility] [phone]

## 2015-06-23 NOTE — Progress Notes (Signed)
Patient did not attend the evening speaker AA meeting.  Pt watched the Superbowl during group time.   

## 2015-06-23 NOTE — Progress Notes (Signed)
DAR NOTE: Pt present with flat affect and depressed mood in the unit. Pt has been observed in the milieu interacting with peers. Pt denies physical pain, took all his meds as scheduled. As per self inventory, pt had a fair night sleep, good appetite, low energy, and poor concentration. Pt rate depression at 5, hopeless ness at 5, and anxiety at 5. Pt goal for today is  "to get in that 14 day program [facility] for my addiction."  Pt's safety ensured with 15 minute and environmental checks. Pt currently denies SI/HI and A/V hallucinations. Pt verbally agrees to seek staff if SI/HI or A/VH occurs and to consult with staff before acting on these thoughts. Will continue POC.

## 2015-06-23 NOTE — Progress Notes (Signed)
Pt reports his day has been ok.  His concern at this time is heartburn.  He comes to Clinical research associate saying that he wants Clinical research associate to order something for heartburn.  When writer suggests Maalox, he adamantly says that it will not help.  When his MAR is checked, he has no order for Maalox, but he has not med allergies.  The NP on call was informed and she ordered the standard Maalox.  Pt still refused.  He was given ginger ale and told that he would need to talk with the MD in the AM for anything else.  Pt took his scheduled Trazodone 100 mg for sleep.  Pt denies SI/HI/AVH.  He states he wants to go for long term treatment after discharge from [facility].  Pt makes his needs known to staff.  Support and encouragement offered.  Safety maintained with q15 minute checks.

## 2015-06-23 NOTE — Clinical Social Work Note (Signed)
[facility] referral made at patient's request.   Samuella Bruin, LCSW Clinical Social Worker [facility] [phone]

## 2015-06-24 MED ORDER — CEPHALEXIN 500 MG PO CAPS
500.0000 mg | ORAL_CAPSULE | Freq: Two times a day (BID) | ORAL | Status: DC
  Administered 2015-06-24 – 2015-06-25 (×2): 500 mg via ORAL
  Filled 2015-06-24 (×3): qty 1
  Filled 2015-06-24 (×2): qty 2
  Filled 2015-06-24: qty 12
  Filled 2015-06-24: qty 1
  Filled 2015-06-24: qty 12

## 2015-06-24 MED ORDER — CEPHALEXIN 500 MG PO CAPS: 500.0000 mg | ORAL_CAPSULE | Freq: Two times a day (BID) | ORAL | Status: DC

## 2015-06-24 NOTE — Progress Notes (Signed)
D   Pt is anxious and depressed   He has paced the halls and asks for stronger pain medication   He did not attend group this evening   He said he doesn't have a problem with ETOH  But that he does other drugs A   Explained to patient that the group was for people with addiction problems not just ETOH   Verbal support given   Medications administered and effectiveness monitored   Q 15 min checks R   Pt safe at present time

## 2015-06-24 NOTE — Progress Notes (Signed)
DAR NOTE: Patient presents with anxious affect and depressed mood. Pt has been in the dayroom interacting with peers. Denies pain, auditory and visual hallucinations.  Rates depression at 6, hopelessness at 6, and anxiety at 5.  Maintained on routine safety checks.  Medications given as prescribed.  Support and encouragement offered as needed.  Attended group and participated.  States goal for today is "getting into the [facility] program." Offered no complaint.

## 2015-06-24 NOTE — [facility] Group Notes (Signed)
[facility] LCSW Group Therapy 06/24/2015  1:15 PM   Type of Therapy: Group Therapy  Participation Level: Did Not Attend. Patient invited to participate but declined.   Samuella Bruin, MSW, LCSW Clinical Social Worker [facility] [phone]

## 2015-06-24 NOTE — Progress Notes (Signed)
Recreation Therapy Notes  Animal-Assisted Activity (AAA) Program Checklist/Progress Notes Patient Eligibility Criteria Checklist & Daily Group note for Rec Tx Intervention  Date: 02.07.2017 Time: 2:45pm Location: 400 Hall Dayroom    AAA/T Program Assumption of Risk Form signed by Patient/ or Parent Legal Guardian yes  Patient is free of allergies or sever asthma yes  Patient reports no fear of animals yes  Patient reports no history of cruelty to animals yes  Patient understands his/her participation is voluntary yes  Patient washes hands before animal contact yes  Patient washes hands after animal contact yes  Behavioral Response: Appropriate  Education: Hand Washing, Appropriate Animal Interaction   Education Outcome: Acknowledges education.   Clinical Observations/Feedback: Patient attended session, petting therapy dog and interacting with peers in group appropriately.   Henry Hicks L Judith Demps, LRT/CTRS  [place] L 06/24/2015 3:05 PM 

## 2015-06-24 NOTE — [facility] Group Notes (Signed)
[facility] Group Notes:  (Nursing/MHT/Case Management/Adjunct)  Date:  06/24/2015  Time:  10:58 AM  Type of Therapy:  Psychoeducational Skills  Participation Level:  Did Not Attend  Participation Quality:  N/A  Affect:  N/A  Cognitive:  N/A  Insight:  None  Engagement in Group:  None  Modes of Intervention:  Discussion and Education  Summary of Progress/Problems: Patient was invited to group but did not attend.  [name] E 06/24/2015, 10:58 AM 

## 2015-06-24 NOTE — [facility] Suicide Risk Assessment (Signed)
[facility] INPATIENT:  Family/Significant Other Suicide Prevention Education  Suicide Prevention Education:  Education Completed; friend Henry Hicks [phone],  (name of family member/significant other) has been identified by the patient as the family member/significant other with whom the patient will be residing, and identified as the person(s) who will aid the patient in the event of a mental health crisis (suicidal ideations/suicide attempt).  With written consent from the patient, the family member/significant other has been provided the following suicide prevention education, prior to the and/or following the discharge of the patient.  The suicide prevention education provided includes the following:  Suicide risk factors  Suicide prevention and interventions  National Suicide Hotline telephone number  [facility] assessment telephone number  [facility] Emergency Assistance 911  [facility] and/or Residential Mobile Crisis Unit telephone number  Request made of family/significant other to:  Remove weapons (e.g., guns, rifles, knives), all items previously/currently identified as safety concern.    Remove drugs/medications (over-the-counter, prescriptions, illicit drugs), all items previously/currently identified as a safety concern.  The family member/significant other verbalizes understanding of the suicide prevention education information provided.  The family member/significant other agrees to remove the items of safety concern listed above.  Henry Hicks, Henry Hicks 06/24/2015, 12:44 PM

## 2015-06-24 NOTE — Progress Notes (Signed)
[facility] MD Progress Note  06/24/2015 7:23 PM Henry Hicks  MRN:  [id number] Subjective:  Henry Hicks is trying to get into a residential treatment program. He was already turned down by [facility] as he is not managing his sleep apnea. He is not sure what he wants to do. Concerned about relapsing and the effect it has on his mood Principal Problem: Major depressive disorder, recurrent, unspecified (HCC) Diagnosis:   Patient Active Problem List   Diagnosis Date Noted  . Cocaine abuse [F14.10] 06/23/2015  . Bipolar 1 disorder, depressed, severe (HCC) [F31.4] 06/21/2015  . Major depressive disorder, recurrent, unspecified (HCC) [F33.9] 04/02/2015   Total Time spent with patient: 20 minutes  Past Psychiatric History: see admission H and P  Past Medical History:  Past Medical History  Diagnosis Date  . Hypertension   . Hypercholesteremia   . Sleep apnea   . Bipolar 1 disorder (HCC)   . Depression   . Homelessness    History reviewed. No pertinent past surgical history. Family History: History reviewed. No pertinent family history. Family Psychiatric  History: see admission H and P Social History:  History  Alcohol Use  . Yes     History  Drug Use  . Yes  . Special: Cocaine, Marijuana    Social History   Social History  . Marital Status: Single    Spouse Name: N/A  . Number of Children: N/A  . Years of Education: N/A   Social History Main Topics  . Smoking status: Current Every Day Smoker    Types: Cigarettes  . Smokeless tobacco: None  . Alcohol Use: Yes  . Drug Use: Yes    Special: Cocaine, Marijuana  . Sexual Activity: Not Asked   Other Topics Concern  . None   Social History Narrative   Additional Social History:                         Sleep: Fair  Appetite:  Fair  Current Medications: Current Facility-Administered Medications  Medication Dose Route Frequency Provider Last Rate Last Dose  . acetaminophen (TYLENOL) tablet 650 mg  650 mg Oral Q6H PRN  Kristeen Mans, NP      . alum & mag hydroxide-simeth (MAALOX/MYLANTA) 200-200-20 MG/5ML suspension 30 mL  30 mL Oral Q6H PRN Worthy Flank, NP      . buPROPion (WELLBUTRIN XL) 24 hr tablet 150 mg  150 mg Oral Daily Rachael Fee, MD   150 mg at 06/24/15 0865  . hydrOXYzine (ATARAX/VISTARIL) tablet 25 mg  25 mg Oral Q6H PRN Sanjuana Kava, NP      . ibuprofen (ADVIL,MOTRIN) tablet 600 mg  600 mg Oral Q4H PRN Rachael Fee, MD      . nicotine (NICODERM CQ - dosed in mg/24 hours) patch 21 mg  21 mg Transdermal Daily Rachael Fee, MD   21 mg at 06/24/15 7846  . pantoprazole (PROTONIX) EC tablet 40 mg  40 mg Oral Daily Rachael Fee, MD   40 mg at 06/24/15 9629  . potassium chloride SA (K-DUR,KLOR-CON) CR tablet 20 mEq  20 mEq Oral BID Sanjuana Kava, NP   20 mEq at 06/24/15 1623  . traZODone (DESYREL) tablet 100 mg  100 mg Oral QHS Sanjuana Kava, NP   100 mg at 06/23/15 2229    Lab Results: No results found for this or any previous visit (from the past 48 hour(s)).  Physical Findings: AIMS: Facial  and Oral Movements Muscles of Facial Expression: None, normal Lips and Perioral Area: None, normal Jaw: None, normal Tongue: None, normal,Extremity Movements Upper (arms, wrists, hands, fingers): None, normal Lower (legs, knees, ankles, toes): None, normal, Trunk Movements Neck, shoulders, hips: None, normal, Overall Severity Severity of abnormal movements (highest score from questions above): None, normal Incapacitation due to abnormal movements: None, normal Patient's awareness of abnormal movements (rate only patient's report): No Awareness, Dental Status Current problems with teeth and/or dentures?: No Does patient usually wear dentures?: No  CIWA:  CIWA-Ar Total: 3 COWS:  COWS Total Score: 1  Musculoskeletal: Strength & Muscle Tone: within normal limits Gait & Station: normal Patient leans: normal  Psychiatric Specialty Exam: Review of Systems  Constitutional: Negative.   Eyes:  Negative.   Respiratory: Negative.   Cardiovascular: Negative.   Gastrointestinal: Negative.   Genitourinary: Negative.   Musculoskeletal: Positive for back pain and neck pain.  Skin: Negative.   Neurological: Negative.   Endo/Heme/Allergies: Negative.   Psychiatric/Behavioral: Positive for depression and substance abuse. The patient is nervous/anxious.     Blood pressure 117/84, pulse 106, temperature 97.7 F (36.5 C), temperature source Oral, resp. rate 16, height  (1.702 m), weight 83.915 kg (185 lb), SpO2 100 %.Body mass index is 28.97 kg/(m^2).  General Appearance: Fairly Groomed  Patent attorney::  Fair  Speech:  Clear and Coherent  Volume:  Normal  Mood:  Anxious, Depressed and worried  Affect:  Restricted  Thought Process:  Coherent and Goal Directed  Orientation:  Full (Time, Place, and Person)  Thought Content:  symptoms events worries concerns  Suicidal Thoughts:  No  Homicidal Thoughts:  No  Memory:  Immediate;   Fair Recent;   Fair Remote;   Fair  Judgement:  Fair  Insight:  Present and Shallow  Psychomotor Activity:  Decreased  Concentration:  Fair  Recall:  [employer] of Knowledge:Fair  Language: Fair  Akathisia:  No  Handed:  Right  AIMS (if indicated):     Assets:  Desire for Improvement  ADL's:  Intact  Cognition: WNL  Sleep:  Number of Hours: 5.75   Treatment Plan Summary: Daily contact with patient to assess and evaluate symptoms and progress in treatment and Medication management Supportive approach/coping skills Cocaine abuse; continue working a relapse prevention plan Depression;pursue the Wellbutrin XL 150 mg in AM Insomnia; continue the Trazodone 100 mg HS PRN sleep Explore residential treatment options Gabriellia Rempel A, MD 06/24/2015, 7:23 PM

## 2015-06-25 MED ORDER — HYDROXYZINE HCL 25 MG PO TABS: 25.0000 mg | ORAL_TABLET | Freq: Four times a day (QID) | ORAL | Status: AC | PRN

## 2015-06-25 MED ORDER — NICOTINE 21 MG/24HR TD PT24: 21.0000 mg | MEDICATED_PATCH | Freq: Every day | TRANSDERMAL | Status: AC

## 2015-06-25 MED ORDER — CEPHALEXIN 500 MG PO CAPS: 500.0000 mg | ORAL_CAPSULE | Freq: Two times a day (BID) | ORAL | Status: AC

## 2015-06-25 MED ORDER — BUPROPION HCL ER (XL) 150 MG PO TB24: 150.0000 mg | ORAL_TABLET | Freq: Every day | ORAL | Status: AC

## 2015-06-25 MED ORDER — TRAZODONE HCL 100 MG PO TABS: 100.0000 mg | ORAL_TABLET | Freq: Every day | ORAL | Status: AC

## 2015-06-25 NOTE — Progress Notes (Signed)
Patient verbalizes readiness for discharge. Follow up plan explained, Rx's given along with sample meds. All belongings returned. Patient verbalizes understanding. Denies SI/HI and assures this Clinical research associate he will seek assistance should that change. Discharged ambulatory and in stable condition with bus passes. Lawrence Marseilles

## 2015-06-25 NOTE — Progress Notes (Signed)
D: Pt has depressed affect and mood.  He reports his day was "so so."  Pt reports "[facility] turned me down because I have sleep apnea."  Pt reports he now plans to try to get into [facility].  He reports passive SI without a plan and he verbally contracts for safety.  He denies HI, denies hallucinations, denies pain.  Pt has been visible in milieu interacting with peers and staff appropriately.  Pt attended evening group.   A: Introduced self to pt.  Met with pt 1:1 and offered support and encouragement.  Medications administered per order.   R: Pt is compliant with medications.  Pt verbally contracts for safety.  Will continue to monitor and assess.

## 2015-06-25 NOTE — Progress Notes (Addendum)
Patient up and visible in the milieu thus far today. Affect flat, mood depressed. Patient watchful, minimal information forwarded to staff. Rates his depression at a 7/10, hopelessness and anxiety both at a 6/10. States his goal is to find a treatment program with the assistance of social work. Emotional support offered. Medicated per orders. No prn's requested or required. Self inventory reviewed. Patient denies SI/HI and verbally contracts for safety should that change. Patient remains safe on level III obs. Lawrence Marseilles \

## 2015-06-25 NOTE — [facility] Group Notes (Signed)
[facility] LCSW Group Therapy 06/25/2015  1:15 PM   Type of Therapy: Group Therapy  Participation Level: Did Not Attend. Patient invited to participate but declined.   Nyles Mitton, MSW, LCSW Clinical Social Worker [facility] [phone]   

## 2015-06-25 NOTE — [facility] Group Notes (Signed)
[facility] LCSW Aftercare Discharge Planning Group Note  06/25/2015  8:45 AM  Participation Quality: Did Not Attend. Patient invited to participate but declined.  Lark Langenfeld, MSW, LCSW Clinical Social Worker [facility] [phone]   

## 2015-06-25 NOTE — Progress Notes (Signed)
  [facility] Adult Case Management Discharge Plan :  Will you be returning to the same living situation after discharge:  Yes,  patient plans to return to previous living situation. He will be provided with information on local shelters.  At discharge, do you have transportation home?: Yes,  patient will be provided with bus pass Do you have the ability to pay for your medications: Yes,  patient will be provided with prescriptions at discharge.   Release of information consent forms completed and in the chart;  Patient's signature needed at discharge.  Patient to Follow up at: Follow-up Information    Follow up with [facility].   Specialty:  Behavioral Health   Why:  Walk-in clinic Monday-Friday between 8am to 3pm for assessment for therapy and medication management services. Please arrive early and let staff know if you are an established client in order to be seen as quickly as possible.    Contact information:   [address] [place] [address] [phone]       Follow up with [facility].   Why:  Referral sent on 06/24/15. Walk-in Monday-Friday at 8am for assessment for possible admission. Bring medication samples and a change of clothing.    Contact information:   [address] [place], [place] [address] [phone]      Next level of care provider has access to [facility] Link:no  Safety Planning and Suicide Prevention discussed: Yes,  with patient  Have you used any form of tobacco in the last 30 days? (Cigarettes, Smokeless Tobacco, Cigars, and/or Pipes): Yes  Has patient been referred to the Quitline?: Patient refused referral  Patient has been referred for addiction treatment: Yes  Jameika Kinn, [place] 06/25/2015, 11:14 AM

## 2015-06-25 NOTE — Discharge Summary (Addendum)
Physician Discharge Summary Note  Patient:  Henry Hicks is an 45 y.o., male MRN:  [id number] DOB:  1971-05-14 Patient phone:  [phone] (home)  Patient address:   [place] [address],  Total Time spent with patient: 30 minutes  Date of Admission:  06/21/2015 Date of Discharge: 06/25/2015  Reason for Admission:  depression  Principal Problem: Major depressive disorder, recurrent, unspecified [facility]) Discharge Diagnoses: Patient Active Problem List   Diagnosis Date Noted  . Major depressive disorder, recurrent, unspecified (HCC) [F33.9] 04/02/2015    Priority: High  . Cocaine abuse [F14.10] 06/23/2015  . Bipolar 1 disorder, depressed, severe (HCC) [F31.4] 06/21/2015    Past Psychiatric History: see above noted  Past Medical History:  Past Medical History  Diagnosis Date  . Hypertension   . Hypercholesteremia   . Sleep apnea   . Bipolar 1 disorder (HCC)   . Depression   . Homelessness    History reviewed. No pertinent past surgical history. Family History: History reviewed. No pertinent family history. Family Psychiatric  History:  Denied Social History:  History  Alcohol Use  . Yes     History  Drug Use  . Yes  . Special: Cocaine, Marijuana    Social History   Social History  . Marital Status: Single    Spouse Name: N/A  . Number of Children: N/A  . Years of Education: N/A   Social History Main Topics  . Smoking status: Current Every Day Smoker    Types: Cigarettes  . Smokeless tobacco: None  . Alcohol Use: Yes  . Drug Use: Yes    Special: Cocaine, Marijuana  . Sexual Activity: Not Asked   Other Topics Concern  . None   Social History Narrative    Hospital Course:  Henry Hicks was admitted for Major depressive disorder, recurrent, unspecified (HCC) and crisis management.  He was treated with the following medications as listed.  Henry Hicks was discharged with current medication and was instructed on how to take medications as  prescribed; (details listed below under Medication List).  Medical problems were identified and treated as needed.  Home medications were restarted as appropriate.  Improvement was monitored by observation and Henry Hicks daily report of symptom reduction.  Emotional and mental status was monitored by daily self-inventory reports completed by Henry Hicks and clinical staff.         Henry Hicks was evaluated by the treatment team for stability and plans for continued recovery upon discharge.  Henry Hicks motivation was an integral factor for scheduling further treatment.  Employment, transportation, bed availability, health status, family support, and any pending legal issues were also considered during his hospital stay.  He was offered further treatment options upon discharge including but not limited to Residential, Intensive Outpatient, and Outpatient treatment.  Henry Hicks will follow up with the services as listed below under Follow Up Information.     Upon completion of this admission the Henry Hicks was both mentally and medically stable for discharge denying suicidal/homicidal ideation, auditory/visual/tactile hallucinations, delusional thoughts and paranoia.     Physical Findings: AIMS: Facial and Oral Movements Muscles of Facial Expression: None, normal Lips and Perioral Area: None, normal Jaw: None, normal Tongue: None, normal,Extremity Movements Upper (arms, wrists, hands, fingers): None, normal Lower (legs, knees, ankles, toes): None, normal, Trunk Movements Neck, shoulders, hips: None, normal, Overall Severity Severity of abnormal movements (highest score from questions above): None, normal Incapacitation due to abnormal movements: None, normal Patient's awareness of abnormal movements (  rate only patient's report): No Awareness, Dental Status Current problems with teeth and/or dentures?: No Does patient usually wear dentures?: No  CIWA:  CIWA-Ar Total: 3 COWS:   COWS Total Score: 1  Musculoskeletal: Strength & Muscle Tone: within normal limits Gait & Station: normal Patient leans: N/A  Psychiatric Specialty Exam:  SEE MD SRA Review of Systems  All other systems reviewed and are negative.   Blood pressure 117/75, pulse 89, temperature 98.1 F (36.7 C), temperature source Oral, resp. rate 16, height  (1.702 m), weight 83.915 kg (185 lb), SpO2 100 %.Body mass index is 28.97 kg/(m^2).  Have you used any form of tobacco in the last 30 days? (Cigarettes, Smokeless Tobacco, Cigars, and/or Pipes): Yes  Has this patient used any form of tobacco in the last 30 days? (Cigarettes, Smokeless Tobacco, Cigars, and/or Pipes) Yes, Rx given  Metabolic Disorder Labs:  Lab Results  Component Value Date   HGBA1C 5.6 06/22/2015   MPG 114 06/22/2015   No results found for: PROLACTIN Lab Results  Component Value Date   CHOL 162 06/22/2015   TRIG 36 06/22/2015   HDL 49 06/22/2015   CHOLHDL 3.3 06/22/2015   VLDL 7 06/22/2015   LDLCALC 106* 06/22/2015    See Psychiatric Specialty Exam and Suicide Risk Assessment completed by Attending Physician prior to discharge.  Discharge destination:  Home  Is patient on multiple antipsychotic therapies at discharge:  No   Has Patient had three or more failed trials of antipsychotic monotherapy by history:  No  Recommended Plan for Multiple Antipsychotic Therapies: NA     Medication List    TAKE these medications      Indication   buPROPion 150 MG 24 hr tablet  Commonly known as:  WELLBUTRIN XL  Take 1 tablet (150 mg total) by mouth daily.   Indication:  Major Depressive Disorder     cephALEXin 500 MG capsule  Commonly known as:  KEFLEX  Take 1 capsule (500 mg total) by mouth every 12 (twelve) hours.   Indication:  Infection of the Skin and Skin Structures     hydrOXYzine 25 MG tablet  Commonly known as:  ATARAX/VISTARIL  Take 1 tablet (25 mg total) by mouth every 6 (six) hours as needed for  anxiety (Sleep).   Indication:  Anxiety     nicotine 21 mg/24hr patch  Commonly known as:  NICODERM CQ - dosed in mg/24 hours  Place 1 patch (21 mg total) onto the skin daily.   Indication:  Nicotine Addiction       Follow-up Information    Follow up with [facility].   Specialty:  Behavioral Health   Why:  Walk-in clinic Monday-Friday between 8am to 3pm for assessment for therapy and medication management services. Please arrive early and let staff know if you are an established client in order to be seen as quickly as possible.    Contact information:   [address] [place] [address] [phone]       Follow up with [facility].   Why:  Referral sent on 06/24/15. Walk-in Monday-Friday at 8am for assessment for possible admission. Bring medication samples, 3 month prescription for your medications, and a change of clothing.    Contact information:   [address] [place], [place] [address] [phone]      Follow-up recommendations:  Activity:  as tol Diet:  as tol  Comments:  1.  Take all your medications as prescribed.  2.  Report any adverse side effects to outpatient provider.                       3.  Patient instructed to not use alcohol or illegal drugs while on prescription medicines.            4.  In the event of worsening symptoms, instructed patient to call 911, the crisis hotline or go to nearest emergency room for evaluation of symptoms.  Signed: Velna Hatchet May Agustin, NP-BC 06/25/2015, 1:11 PM  I personally assessed the patient and formulated the plan Madie Reno A. Dub Mikes, M.D.

## 2015-06-25 NOTE — Plan of Care (Signed)
Problem: Alteration in mood Goal: STG-Patient reports thoughts of self-harm to staff Outcome: Progressing Patient has endorsed passive SI previously however denies at present. Verbally contracts for safety.  Problem: Alteration in mood & ability to function due to Goal: STG-Patient will comply with prescribed medication regimen (Patient will comply with prescribed medication regimen)  Outcome: Progressing Patient has been med compliant.

## 2015-06-25 NOTE — Progress Notes (Signed)
Recreation Therapy Notes  Date: 02.08.2017  Time: 9:30am Location: [address] Group Room   Group Topic: Stress Management  Goal Area(s) Addresses:  Patient will actively participate in stress management techniques presented during session.   Behavioral Response: Did not attend.   Dacey Milberger L Spenser Cong, LRT/CTRS        Prajna Vanderpool L 06/25/2015 2:42 PM 

## 2015-06-25 NOTE — [facility] Suicide Risk Assessment (Signed)
[facility] Discharge Suicide Risk Assessment   Principal Problem: Major depressive disorder, recurrent, unspecified (HCC) Discharge Diagnoses:  Patient Active Problem List   Diagnosis Date Noted  . Cocaine abuse [F14.10] 06/23/2015  . Bipolar 1 disorder, depressed, severe (HCC) [F31.4] 06/21/2015  . Major depressive disorder, recurrent, unspecified (HCC) [F33.9] 04/02/2015    Total Time spent with patient: 20 minutes  Musculoskeletal: Strength & Muscle Tone: within normal limits Gait & Station: normal Patient leans: normal  Psychiatric Specialty Exam: Review of Systems  Constitutional: Negative.   Eyes: Negative.   Respiratory: Negative.   Cardiovascular: Negative.   Gastrointestinal: Negative.   Genitourinary: Negative.   Musculoskeletal: Positive for back pain and neck pain.  Skin: Negative.   Neurological: Negative.   Endo/Heme/Allergies: Negative.   Psychiatric/Behavioral: Positive for substance abuse.    Blood pressure 117/75, pulse 89, temperature 98.1 F (36.7 C), temperature source Oral, resp. rate 16, height  (1.702 m), weight 83.915 kg (185 lb), SpO2 100 %.Body mass index is 28.97 kg/(m^2).  General Appearance: Fairly Groomed  Patent attorney::  Fair  Speech:  Clear and Coherent409  Volume:  Normal  Mood:  Euthymic  Affect:  Appropriate  Thought Process:  Coherent and Goal Directed  Orientation:  Full (Time, Place, and Person)  Thought Content:  plans as he moves on, relapse prevention plan  Suicidal Thoughts:  No  Homicidal Thoughts:  No  Memory:  Immediate;   Fair Recent;   Fair Remote;   Fair  Judgement:  Fair  Insight:  Present and Shallow  Psychomotor Activity:  Normal  Concentration:  Fair  Recall:  [employer] of Knowledge:Fair  Language: Fair  Akathisia:  No  Handed:  Right  AIMS (if indicated):     Assets:  Desire for Improvement  Sleep:  Number of Hours: 5  Cognition: WNL  ADL's:  Intact  In full contact with reality. There are no active S/S  of withdrawal. There are no active SI plans or intent. He is aware that he needs to address his sleep apnea and states he plans to do so. Meanwhile he is going to continue to work on his long term abstinence. He plans to got to [facility] walk in for an assessment Mental Status Per Nursing Assessment::   On Admission:  Suicidal ideation indicated by patient  Demographic Factors:  Male  Loss Factors: None identified  Historical Factors: none identified  Risk Reduction Factors:   Sense of responsibility to family  Continued Clinical Symptoms:  Depression:   Comorbid alcohol abuse/dependence Alcohol/Substance Abuse/Dependencies  Cognitive Features That Contribute To Risk:  None    Suicide Risk:  Minimal: No identifiable suicidal ideation.  Patients presenting with no risk factors but with morbid ruminations; may be classified as minimal risk based on the severity of the depressive symptoms  Follow-up Information    Follow up with [facility].   Specialty:  Behavioral Health   Why:  Walk-in clinic Monday-Friday between 8am to 3pm for assessment for therapy and medication management services. Please arrive early and let staff know if you are an established client in order to be seen as quickly as possible.    Contact information:   [address] [place] [address] [phone]       Follow up with [facility].   Why:  Referral sent on 06/24/15. Walk-in Monday-Friday at 8am for assessment for possible admission. Bring medication samples, 3 month prescription for your medications, and a change of clothing.  Contact information:   [address] [place], [place] [address] [phone]      Plan Of Care/Follow-up recommendations:  Activity:  as tolerated Diet:  regular Follow up as above Jamarr Treinen A, MD 06/25/2015, 12:21 PM

## 2015-10-07 ENCOUNTER — Encounter (HOSPITAL_COMMUNITY): Payer: Self-pay

## 2015-10-07 ENCOUNTER — Emergency Department (HOSPITAL_COMMUNITY)
Admission: EM | Admit: 2015-10-07 | Discharge: 2015-10-07 | Disposition: A | Discharge status: Home / Self Care | Payer: Self-pay | Attending: Emergency Medicine | Admitting: Emergency Medicine

## 2015-10-07 DIAGNOSIS — Z590 Homelessness: Secondary | ICD-10-CM | POA: Insufficient documentation

## 2015-10-07 DIAGNOSIS — F319 Bipolar disorder, unspecified: Secondary | ICD-10-CM | POA: Insufficient documentation

## 2015-10-07 DIAGNOSIS — S40862A Insect bite (nonvenomous) of left upper arm, initial encounter: Secondary | ICD-10-CM | POA: Insufficient documentation

## 2015-10-07 DIAGNOSIS — Y998 Other external cause status: Secondary | ICD-10-CM | POA: Insufficient documentation

## 2015-10-07 DIAGNOSIS — S30860A Insect bite (nonvenomous) of lower back and pelvis, initial encounter: Secondary | ICD-10-CM | POA: Insufficient documentation

## 2015-10-07 DIAGNOSIS — I10 Essential (primary) hypertension: Secondary | ICD-10-CM | POA: Insufficient documentation

## 2015-10-07 DIAGNOSIS — F17210 Nicotine dependence, cigarettes, uncomplicated: Secondary | ICD-10-CM | POA: Insufficient documentation

## 2015-10-07 DIAGNOSIS — Z79899 Other long term (current) drug therapy: Secondary | ICD-10-CM | POA: Insufficient documentation

## 2015-10-07 DIAGNOSIS — K029 Dental caries, unspecified: Secondary | ICD-10-CM | POA: Insufficient documentation

## 2015-10-07 DIAGNOSIS — K047 Periapical abscess without sinus: Secondary | ICD-10-CM | POA: Insufficient documentation

## 2015-10-07 DIAGNOSIS — W57XXXA Bitten or stung by nonvenomous insect and other nonvenomous arthropods, initial encounter: Secondary | ICD-10-CM | POA: Insufficient documentation

## 2015-10-07 DIAGNOSIS — K0889 Other specified disorders of teeth and supporting structures: Secondary | ICD-10-CM

## 2015-10-07 DIAGNOSIS — G4730 Sleep apnea, unspecified: Secondary | ICD-10-CM | POA: Insufficient documentation

## 2015-10-07 DIAGNOSIS — Y9289 Other specified places as the place of occurrence of the external cause: Secondary | ICD-10-CM | POA: Insufficient documentation

## 2015-10-07 DIAGNOSIS — Y9389 Activity, other specified: Secondary | ICD-10-CM | POA: Insufficient documentation

## 2015-10-07 DIAGNOSIS — S40861A Insect bite (nonvenomous) of right upper arm, initial encounter: Secondary | ICD-10-CM | POA: Insufficient documentation

## 2015-10-07 MED ORDER — HYDROXYZINE HCL 25 MG PO TABS
25.0000 mg | ORAL_TABLET | Freq: Once | ORAL | Status: AC
  Administered 2015-10-07: 25 mg via ORAL
  Filled 2015-10-07: qty 1

## 2015-10-07 MED ORDER — DEXAMETHASONE SODIUM PHOSPHATE 10 MG/ML IJ SOLN
10.0000 mg | Freq: Once | INTRAMUSCULAR | Status: AC
  Administered 2015-10-07: 10 mg via INTRAMUSCULAR
  Filled 2015-10-07: qty 1

## 2015-10-07 MED ORDER — PENICILLIN V POTASSIUM 250 MG PO TABS
500.0000 mg | ORAL_TABLET | Freq: Once | ORAL | Status: AC
  Administered 2015-10-07: 500 mg via ORAL
  Filled 2015-10-07: qty 2

## 2015-10-07 MED ORDER — HYDROXYZINE HCL 25 MG PO TABS: 25.0000 mg | ORAL_TABLET | Freq: Four times a day (QID) | ORAL | Status: AC

## 2015-10-07 MED ORDER — PREDNISONE 10 MG (21) PO TBPK: 10.0000 mg | ORAL_TABLET | Freq: Every day | ORAL | Status: DC

## 2015-10-07 MED ORDER — BUPIVACAINE-EPINEPHRINE (PF) 0.5% -1:200000 IJ SOLN
1.8000 mL | Freq: Once | INTRAMUSCULAR | Status: AC
  Administered 2015-10-07: 1.8 mL
  Filled 2015-10-07: qty 1.8

## 2015-10-07 MED ORDER — PENICILLIN V POTASSIUM 500 MG PO TABS: 500.0000 mg | ORAL_TABLET | Freq: Four times a day (QID) | ORAL | Status: AC

## 2015-10-07 NOTE — ED Notes (Signed)
Patient here with upper right dental pain x 1 month. Also here because slept in hotel last night and woke up to bites on arms

## 2015-10-07 NOTE — ED Provider Notes (Signed)
CSN: [id number]     Arrival date & time 10/07/15  1239 History  By signing my name below, I, Henry Hicks and Henry Hicks, attest that this documentation has been prepared under the direction and in the presence of Chloris Marcoux, PA-C. Electronically Signed: Octavia HeirArianna Hicks, ED Scribe. 10/07/2015. 2:55 PM.    Chief Complaint  Patient presents with  . Rash      The history is provided by the patient. No language interpreter was used.   HPI Comments: Henry Hicks is a 45 y.o. male with a PMHx of HTN, who presents to the Emergency Department complaining of sudden onset, gradually worsening, itchy bumps on his bilateral arms that began one night ago. Pt notes that his rash spread from his arms to his back. Pt states that he stayed at a motel the night before that could be infested with bedbugs. Pt also states that he was not wearing a shirt in his sleep. Pt has not tried any treatment to alleviate his itching. Pt denies fever, nausea/vomiting, discharge from the bites, or any other complaints.   Pt also complains of a sudden onset, gradually worsening, moderate right upper dental pain onset one month ago. Pt reports he has a broken tooth where he has pain and he has not been able follow up with a dentist. He has not taken any medication to alleviate his pain.   Past Medical History  Diagnosis Date  . Hypertension   . Hypercholesteremia   . Sleep apnea   . Bipolar 1 disorder (HCC)   . Depression   . Homelessness    History reviewed. No pertinent past surgical history. No family history on file. Social History  Substance Use Topics  . Smoking status: Current Every Day Smoker    Types: Cigarettes  . Smokeless tobacco: None  . Alcohol Use: Yes    Review of Systems  Constitutional: Negative for fever and chills.  HENT: Positive for dental problem. Negative for trouble swallowing.   Gastrointestinal: Negative for nausea and vomiting.  Skin: Positive for rash.      Allergies  Review  of patient's allergies indicates no known allergies.  Home Medications   Prior to Admission medications   Medication Sig Start Date End Date Taking? Authorizing Provider  buPROPion (WELLBUTRIN XL) 150 MG 24 hr tablet Take 1 tablet (150 mg total) by mouth daily. 06/25/15   Adonis BrookSheila Agustin, NP  cephALEXin (KEFLEX) 500 MG capsule Take 1 capsule (500 mg total) by mouth every 12 (twelve) hours. 06/25/15   Adonis BrookSheila Agustin, NP  hydrOXYzine (ATARAX/VISTARIL) 25 MG tablet Take 1 tablet (25 mg total) by mouth every 6 (six) hours as needed for anxiety (Sleep). 06/25/15   Adonis BrookSheila Agustin, NP  hydrOXYzine (ATARAX/VISTARIL) 25 MG tablet Take 1 tablet (25 mg total) by mouth every 6 (six) hours. 10/07/15   Jaecion Dempster C Lecia Esperanza, PA-C  nicotine (NICODERM CQ - DOSED IN MG/24 HOURS) 21 mg/24hr patch Place 1 patch (21 mg total) onto the skin daily. 06/25/15   Adonis BrookSheila Agustin, NP  penicillin v potassium (VEETID) 500 MG tablet Take 1 tablet (500 mg total) by mouth 4 (four) times daily. 10/07/15 10/14/15  Joleah Kosak C Boyde Grieco, PA-C  predniSONE (STERAPRED UNI-PAK 21 TAB) 10 MG (21) TBPK tablet Take 1 tablet (10 mg total) by mouth daily. Take 6 tabs by mouth daily  for 2 days, then 5 tabs for 2 days, then 4 tabs for 2 days, then 3 tabs for 2 days, 2 tabs for 2 days, then 1  tab by mouth daily for 2 days 10/07/15   Anselm Pancoast, PA-C  traZODone (DESYREL) 100 MG tablet Take 1 tablet (100 mg total) by mouth at bedtime. 06/25/15   Adonis Brook, NP   Triage Vitals: BP 146/95 mmHg  Pulse 75  Temp(Src) 98 F (36.7 C) (Oral)  Resp 18  Ht  (1.702 m)  Wt 185 lb (83.915 kg)  BMI 28.97 kg/m2  SpO2 100% Physical Exam  Constitutional: He appears well-developed and well-nourished. No distress.  HENT:  Head: Normocephalic and atraumatic.  Right first maxillary molar - evident decay and dental caries. No area of fluctuance that would suggest an abscess. No swelling to surrounding tissues. No difficulty breathing or swallowing. Patient readily handles oral  secretions without difficulty.  Eyes: Conjunctivae are normal.  Neck: Neck supple.  Cardiovascular: Normal rate and regular rhythm.   Pulmonary/Chest: Effort normal.  Neurological: He is alert.  Skin: Skin is warm and dry. Rash noted. He is not diaphoretic.  Scattered singular raised erythematous lesions across bilateral arms and back   Nursing note and vitals reviewed.   ED Course  .Nerve Block Date/Time: 10/07/2015 3:02 PM Performed by: Anselm Pancoast Authorized by: Anselm Pancoast Consent: Verbal consent obtained. Risks and benefits: risks, benefits and alternatives were discussed Consent given by: patient Patient understanding: patient states understanding of the procedure being performed Patient consent: the patient's understanding of the procedure matches consent given Procedure consent: procedure consent matches procedure scheduled Patient identity confirmed: verbally with patient and arm band Indications: pain relief Body area: face/mouth Nerve: posterior superior alveolar Laterality: right Patient sedated: no Patient position: supine Needle gauge: 27 G Location technique: anatomical landmarks Local anesthetic: bupivacaine 0.5% with epinephrine Anesthetic total: 1.8 ml Outcome: pain improved Patient tolerance: Patient tolerated the procedure well with no immediate complications    DIAGNOSTIC STUDIES: Oxygen Saturation is 100% on RA, normal by my interpretation.  COORDINATION OF CARE: 2:00 PM Discussed treatment plan which includes Prednisone and nerve block shot to numb area of toothache with pt at bedside and pt agreed to plan. Will give pt dental resources to be able to follow up for dental pain.    MDM   Final diagnoses:  Tooth pain  Bed bug bite    [name] presents with complaints of dental pain for a month and bug bites that occurred yesterday.  1. Dental pain: No area of abscess that would benefit from I&D. Patient given resources for dentist  follow-up. Antibiotics prescribed. Instructed on the importance of dental follow-up. Patient improved after dental block. 2. Patient's skin lesions are consistent with bedbug bites. No bedbugs observed on the patient while here in the ED. Patient improved with hydroxyzine and steroid injection. Case management was able to secure the patient an appointment with a PCP and give him resources for his medications. Home care and return precautions discussed. Patient voiced understanding of these instructions, accepts the plan, and is comfortable with discharge.  I personally performed the services described in this documentation, which was scribed in my presence. The recorded information has been reviewed and is accurate.    Anselm Pancoast, PA-C 10/07/15 1851  Lorre Nick, MD 10/10/15 [phone]

## 2015-10-07 NOTE — Discharge Instructions (Signed)
You have been seen today for dental pain and bug bites.  1. Dental pain: UR noted to have decay in the tooth in question. This must be addressed by a dentist as soon as possible, but definitely within the timeframe you are taking the antibiotic. Please take all of your antibiotics until finished!   You may develop abdominal discomfort or diarrhea from the antibiotic.  You may help offset this with probiotics which you can buy or get in yogurt. Do not eat or take the probiotics until 2 hours after your antibiotic.  2. Bug bites: These bites appear to be from bedbugs. You may have acquired them from bed linens at an infested location. The only true way to get rid of the bedbugs is to get rid of any infested materials.  3. Medications: a) Prednisone to reduce inflammation from itching, 2) Hydroxyzine to reduce itching, 3) Penicillin in anticipation of your dental visit. Follow up with PCP as needed. Return to ED should symptoms worsen.  Dental Assistance If the dentist on-call cannot see you, please use the resources below:   Patients with Medicaid: [facility] [phone], [phone] [address], [phone]  If unable to pay, or uninsured, contact [facility] [phone]) or [facility] [phone] in [place], [phone] in [place]) to become qualified for the adult dental clinic  Other Low-Cost Community Dental Services: [facility] [address], [place], [place], [address]    [phone], [phone]. [phone]    2nd and 4th Thursday of the month at 6:30am    10 clients each day by appointment, can sometimes see walk-in     patients if someone does not show for an appointment [facility]- [address], [place], [place], [address]    [phone] [facility]- [address], [place], [place], [address]    [phone]  [facility]- [phone] [facility]-  [phone] [facility]- [phone]  Dental Resource Guide  [facility] [address], [address] [place], [place] [address] [phone]  [facility] [address] [place], [place] [address] [phone]  [facility] [address] [place], [place] [address] [phone]  [facility] [address], [address] [place], [place] [address] [phone]  [facility] [address] [place], [place] [address] [phone]  [facility] [employer] Www.[email or web address].[email or web address]/patientcare/[email or web address]/becomepatient  [employer] [address] [place], [place] [address] [phone]  Website for free, low-income, or sliding scale dental services in [place]: www.freedental.us  To find a dentist in [place] and surrounding areas: Guy[employer].siwww.[email or web address]/for-the-public/find-[email or web address]-dentist  [facility] Test[employer].athttp://www.ncdental.org/meetings-events/[place]-missions-of-mercy  [facility] Medicaid Dentist http://[email or web address]/https://dma.[email or web address]/find-[email or web address]-doctor/medicaid-dental-providers

## 2015-10-07 NOTE — ED Notes (Signed)
Pt is in stable condition upon d/c and ambulates from ED. 

## 2015-10-07 NOTE — Care Management Note (Signed)
Case Management Note  Patient Details  Name: Lavert Tagg MRN: [id number] Date of Birth: 02/10/1971  Subjective/Objective:                    Action/Plan: CM was consulted for medication assistance. CM met with patient at bedside patient reports, he is homeless and spent the night in a motel where he got be notd bug bites. Patient is active with [facility], but the [facility] is currently closed. Discussed MATCH program with patient he is agreeable, however, does not have the $3 co-pay. CM will have $3 co-pay waived. Patient was enrolled and MATCH letter printed and given with instructions on how to redeem. Patient agrees to follow up at the [facility],  Patient verbalized understanding and teach back. Mary Anne NP at [facility] was made aware that patient will follow up with her at the [facility]. No further questions or concerns verbalized. No further CM needs identified.   Expected Discharge Date:    10/07/2015             Expected Discharge Plan:  Homeless Shelter  In-House Referral:     Discharge planning Services  CM Consult, Follow-up appt scheduled, MATCH Program  Post Acute Care Choice:    Choice offered to:  Patient  DME Arranged:    DME Agency:     HH Arranged:    HH Agency:     Status of Service:  Completed, signed off  Medicare Important Message Given:    Date Medicare IM Given:    Medicare IM give by:    Date Additional Medicare IM Given:    Additional Medicare Important Message give by:     If discussed at Long Length of Stay Meetings, dates discussed:    Additional Comments:  ROGERS, WANDALYN, RN 10/07/2015, 5:34 PM  

## 2016-08-20 ENCOUNTER — Emergency Department (HOSPITAL_BASED_OUTPATIENT_CLINIC_OR_DEPARTMENT_OTHER): Payer: Self-pay

## 2016-08-20 ENCOUNTER — Emergency Department (HOSPITAL_BASED_OUTPATIENT_CLINIC_OR_DEPARTMENT_OTHER)
Admission: EM | Admit: 2016-08-20 | Discharge: 2016-08-20 | Disposition: A | Discharge status: Home / Self Care | Payer: Self-pay | Attending: Emergency Medicine | Admitting: Emergency Medicine

## 2016-08-20 ENCOUNTER — Encounter (HOSPITAL_BASED_OUTPATIENT_CLINIC_OR_DEPARTMENT_OTHER): Payer: Self-pay | Admitting: *Deleted

## 2016-08-20 DIAGNOSIS — I10 Essential (primary) hypertension: Secondary | ICD-10-CM | POA: Insufficient documentation

## 2016-08-20 DIAGNOSIS — F17210 Nicotine dependence, cigarettes, uncomplicated: Secondary | ICD-10-CM | POA: Insufficient documentation

## 2016-08-20 DIAGNOSIS — J40 Bronchitis, not specified as acute or chronic: Secondary | ICD-10-CM | POA: Insufficient documentation

## 2016-08-20 MED ORDER — AZITHROMYCIN 250 MG PO TABS: ORAL_TABLET | ORAL | 0 refills | Status: AC

## 2016-08-20 MED ORDER — ALBUTEROL SULFATE HFA 108 (90 BASE) MCG/ACT IN AERS
2.0000 | INHALATION_SPRAY | RESPIRATORY_TRACT | Status: DC | PRN
  Administered 2016-08-20: 2 via RESPIRATORY_TRACT
  Filled 2016-08-20: qty 6.7

## 2016-08-20 MED ORDER — PREDNISONE 20 MG PO TABS: ORAL_TABLET | ORAL | 0 refills | Status: AC

## 2016-08-20 MED ORDER — BENZONATATE 100 MG PO CAPS: 100.0000 mg | ORAL_CAPSULE | Freq: Three times a day (TID) | ORAL | 0 refills | Status: AC

## 2016-08-20 NOTE — ED Provider Notes (Signed)
[facility] Provider Note   CSN: [id number] Arrival date & time: 08/20/16  2035  By signing my name below, I, Henry Hicks, attest that this documentation has been prepared under the direction and in the presence of Henry Helper, PA-C. Electronically Signed: Freida Hicks, Scribe. 08/20/2016. 9:55 PM.  History   Chief Complaint Chief Complaint  Patient presents with  . Cough     The history is provided by the patient. No language interpreter was used.    HPI Comments:  Henry Hicks is a 46 y.o. male who presents to the Emergency Department complaining of a persistent cough x 1 month. His cough is productive of yellow/green sputum. He has also noticed traces of blood with frequent coughing. Pt has associated body aches, chills, and post-tussive vomiting. He has tried OTC meds without relief. Pt is a current smoker. He denies recent travel outside of the country and recent sick contacts. Pt did not receive a flu shot this season.     Past Medical History:  Diagnosis Date  . Bipolar 1 disorder (HCC)   . Depression   . Homelessness   . Hypercholesteremia   . Hypertension   . Sleep apnea     Patient Active Problem List   Diagnosis Date Noted  . Cocaine abuse 06/23/2015  . Bipolar 1 disorder, depressed, severe (HCC) 06/21/2015  . Major depressive disorder, recurrent, unspecified 04/02/2015    History reviewed. No pertinent surgical history.     Home Medications    Prior to Admission medications   Medication Sig Start Date End Date Taking? Authorizing Provider  buPROPion (WELLBUTRIN XL) 150 MG 24 hr tablet Take 1 tablet (150 mg total) by mouth daily. 06/25/15   Adonis Brook, NP  cephALEXin (KEFLEX) 500 MG capsule Take 1 capsule (500 mg total) by mouth every 12 (twelve) hours. 06/25/15   Adonis Brook, NP  hydrOXYzine (ATARAX/VISTARIL) 25 MG tablet Take 1 tablet (25 mg total) by mouth every 6 (six) hours as needed for anxiety (Sleep). 06/25/15   Adonis Brook,  NP  hydrOXYzine (ATARAX/VISTARIL) 25 MG tablet Take 1 tablet (25 mg total) by mouth every 6 (six) hours. 10/07/15   Shawn C Joy, PA-C  nicotine (NICODERM CQ - DOSED IN MG/24 HOURS) 21 mg/24hr patch Place 1 patch (21 mg total) onto the skin daily. 06/25/15   Adonis Brook, NP  predniSONE (STERAPRED UNI-PAK 21 TAB) 10 MG (21) TBPK tablet Take 1 tablet (10 mg total) by mouth daily. Take 6 tabs by mouth daily  for 2 days, then 5 tabs for 2 days, then 4 tabs for 2 days, then 3 tabs for 2 days, 2 tabs for 2 days, then 1 tab by mouth daily for 2 days 10/07/15   Shawn C Joy, PA-C  traZODone (DESYREL) 100 MG tablet Take 1 tablet (100 mg total) by mouth at bedtime. 06/25/15   Adonis Brook, NP    Family History No family history on file.  Social History Social History  Substance Use Topics  . Smoking status: Current Every Day Smoker    Types: Cigarettes  . Smokeless tobacco: Never Used  . Alcohol use Yes     Allergies   Patient has no known allergies.   Review of Systems Review of Systems  Constitutional: Positive for chills.  Respiratory: Positive for cough.   Gastrointestinal: Positive for vomiting.     Physical Exam Updated Vital Signs BP (!) 149/96   Pulse 95   Temp 98 F (36.7 C) (Oral)  Resp 19   Ht  (1.702 m)   Wt 200 lb (90.7 kg)   SpO2 100%   BMI 31.32 kg/m   Physical Exam  Constitutional: He is oriented to person, place, and time. He appears well-developed and well-nourished. No distress.  HENT:  Head: Normocephalic and atraumatic.  Right Ear: Tympanic membrane normal.  Left Ear: Tympanic membrane normal.  Nose: Rhinorrhea present.  Mouth/Throat: Uvula is midline.  No tonsillar enlargement or exudate   Eyes: Conjunctivae are normal.  Cardiovascular: Normal rate.   Pulmonary/Chest: Effort normal. He has no wheezes. He has rhonchi. He has no rales.  Faint scattered rhonchi; no wheezes or rales  Abdominal: He exhibits no distension.  Neurological: He is alert  and oriented to person, place, and time.  Skin: Skin is warm and dry.  Psychiatric: He has a normal mood and affect.  Nursing note and vitals reviewed.    ED Treatments / Results  DIAGNOSTIC STUDIES:  Oxygen Saturation is 100% on RA, normal by my interpretation.    COORDINATION OF CARE:  9:52 PM Discussed treatment plan with pt at bedside and pt agreed to plan.  Labs (all labs ordered are listed, but only abnormal results are displayed) Labs Reviewed - No data to display  EKG  EKG Interpretation None       Radiology Dg Chest 2 View  Result Date: 08/20/2016 CLINICAL DATA:  Cough for 3 weeks EXAM: CHEST  2 VIEW COMPARISON:  05/31/2015 FINDINGS: The lungs are clear. The pulmonary vasculature is normal. Heart size is normal. Hilar and mediastinal contours are unremarkable. There is no pleural effusion. IMPRESSION: No active cardiopulmonary disease. Electronically Signed   By: Ellery Plunk M.D.   On: 08/20/2016 21:27    Procedures Procedures (including critical care time)  Medications Ordered in ED Medications - No data to display   Initial Impression / Assessment and Plan / ED Course  I have reviewed the triage vital signs and the nursing notes.  Pertinent labs & imaging results that were available during my care of the patient were reviewed by me and considered in my medical decision making (see chart for details).     BP (!) 149/96   Pulse 95   Temp 98 F (36.7 C) (Oral)   Resp 19   Ht  (1.702 m)   Wt 90.7 kg   SpO2 100%   BMI 31.32 kg/m     Final Clinical Impressions(s) / ED Diagnoses   Final diagnoses:  Bronchitis    New Prescriptions New Prescriptions   AZITHROMYCIN (ZITHROMAX Z-PAK) 250 MG TABLET    2 po day one, then 1 daily x 4 days   BENZONATATE (TESSALON) 100 MG CAPSULE    Take 1 capsule (100 mg total) by mouth every 8 (eight) hours.   PREDNISONE (DELTASONE) 20 MG TABLET    3 tabs po day one, then 2 tabs daily x 4 days   I  personally performed the services described in this documentation, which was scribed in my presence. The recorded information has been reviewed and is accurate.   Due to prolonged sxs of sickness, will prescribe Zpak to treat for potential atypical pna.  Inhaler, steroid, and cough medication prescribed. Work note provided.      Henry Helper, PA-C 08/20/16 2157    Maia Plan, MD 08/22/16 [phone]

## 2016-08-20 NOTE — ED Triage Notes (Signed)
Cough x 3 weeks. Chills.

## 2016-08-20 NOTE — ED Notes (Signed)
ED Provider at bedside. 

## 2016-11-12 IMAGING — CR DG CHEST 2V
2 series · 2 of 2 positions shown · non-contrast
Comparison: Chest radiograph performed 04/01/2015

CLINICAL DATA: Acute onset of right-sided shoulder pain and cough.
Initial encounter.

EXAM:
CHEST  2 VIEW

[chest pa]
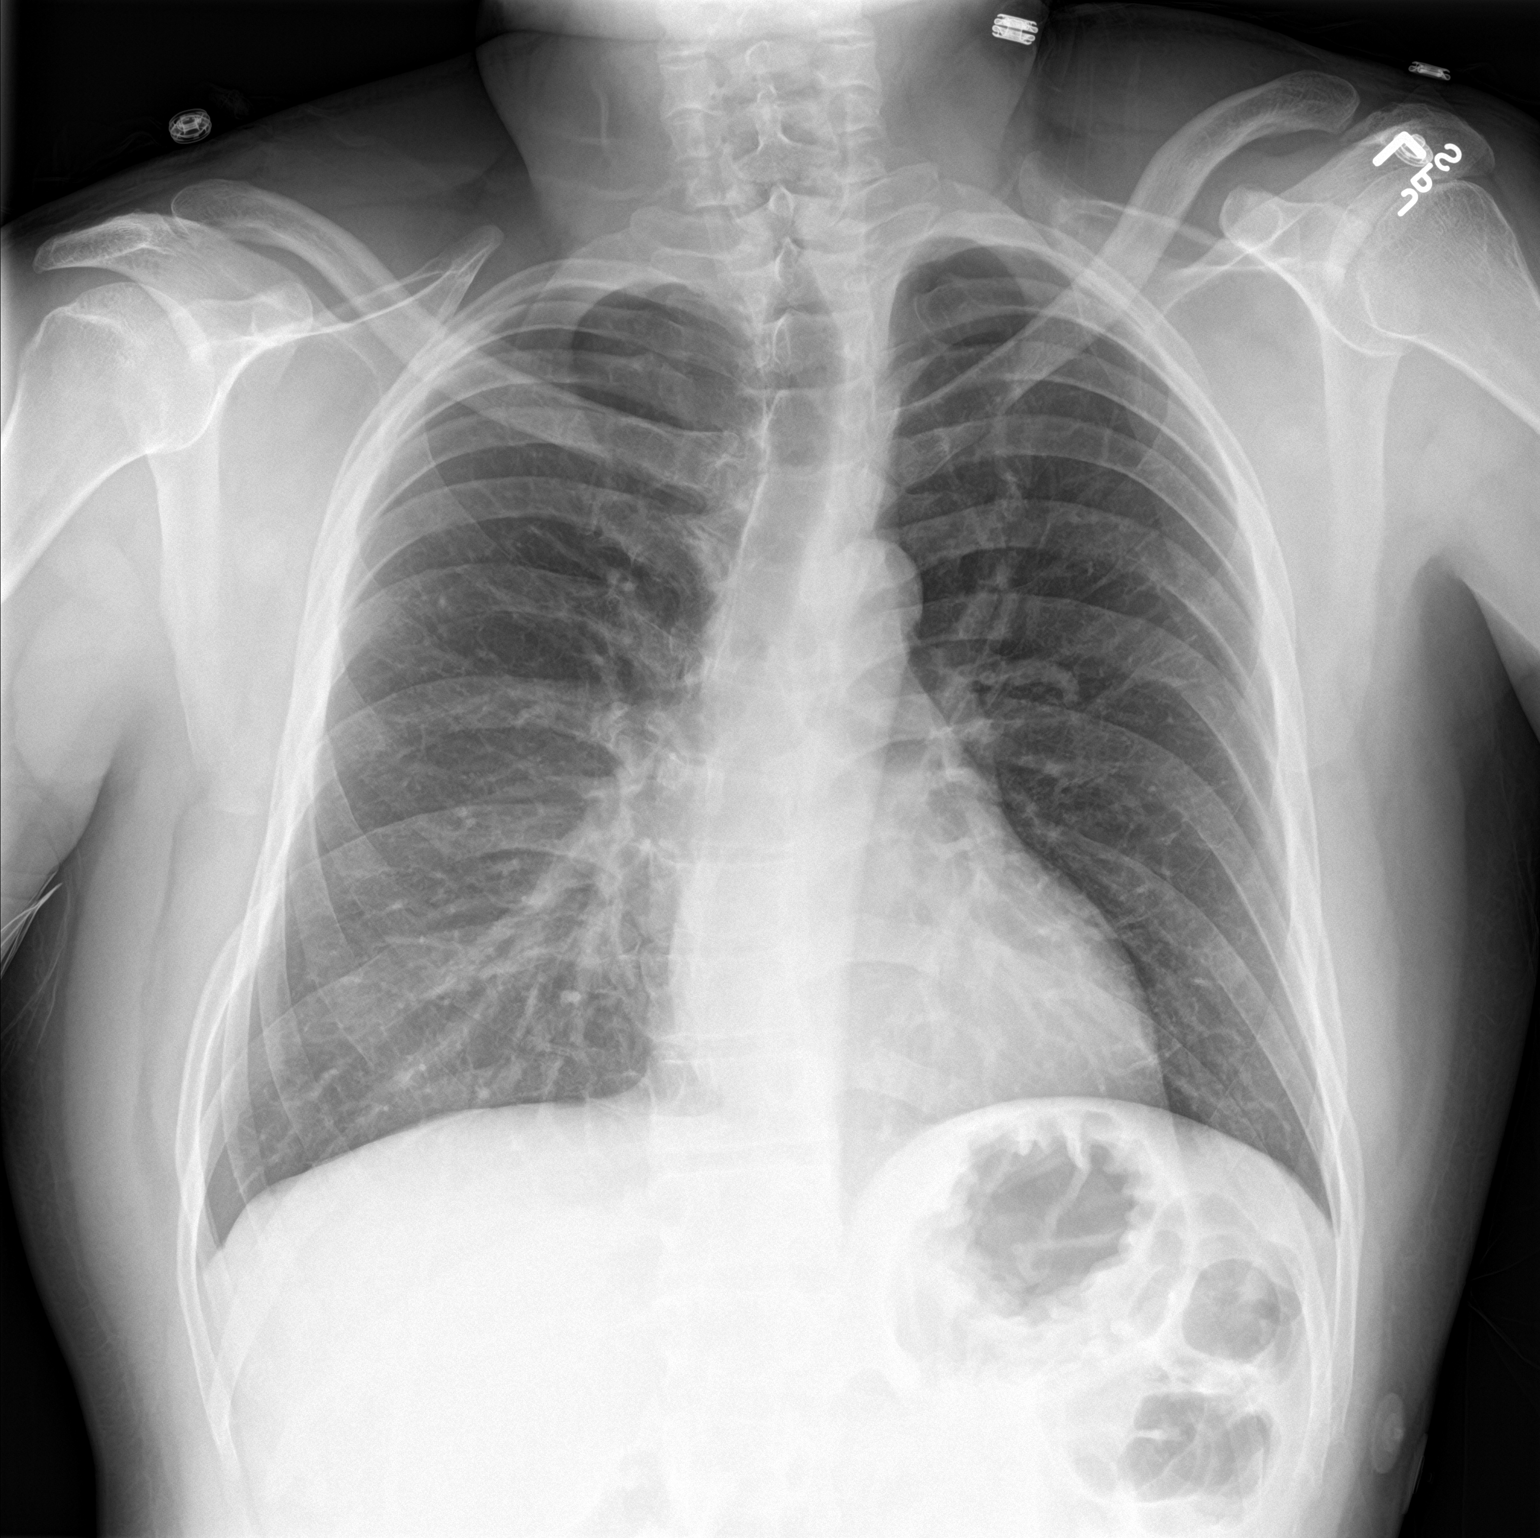

[chest lat]
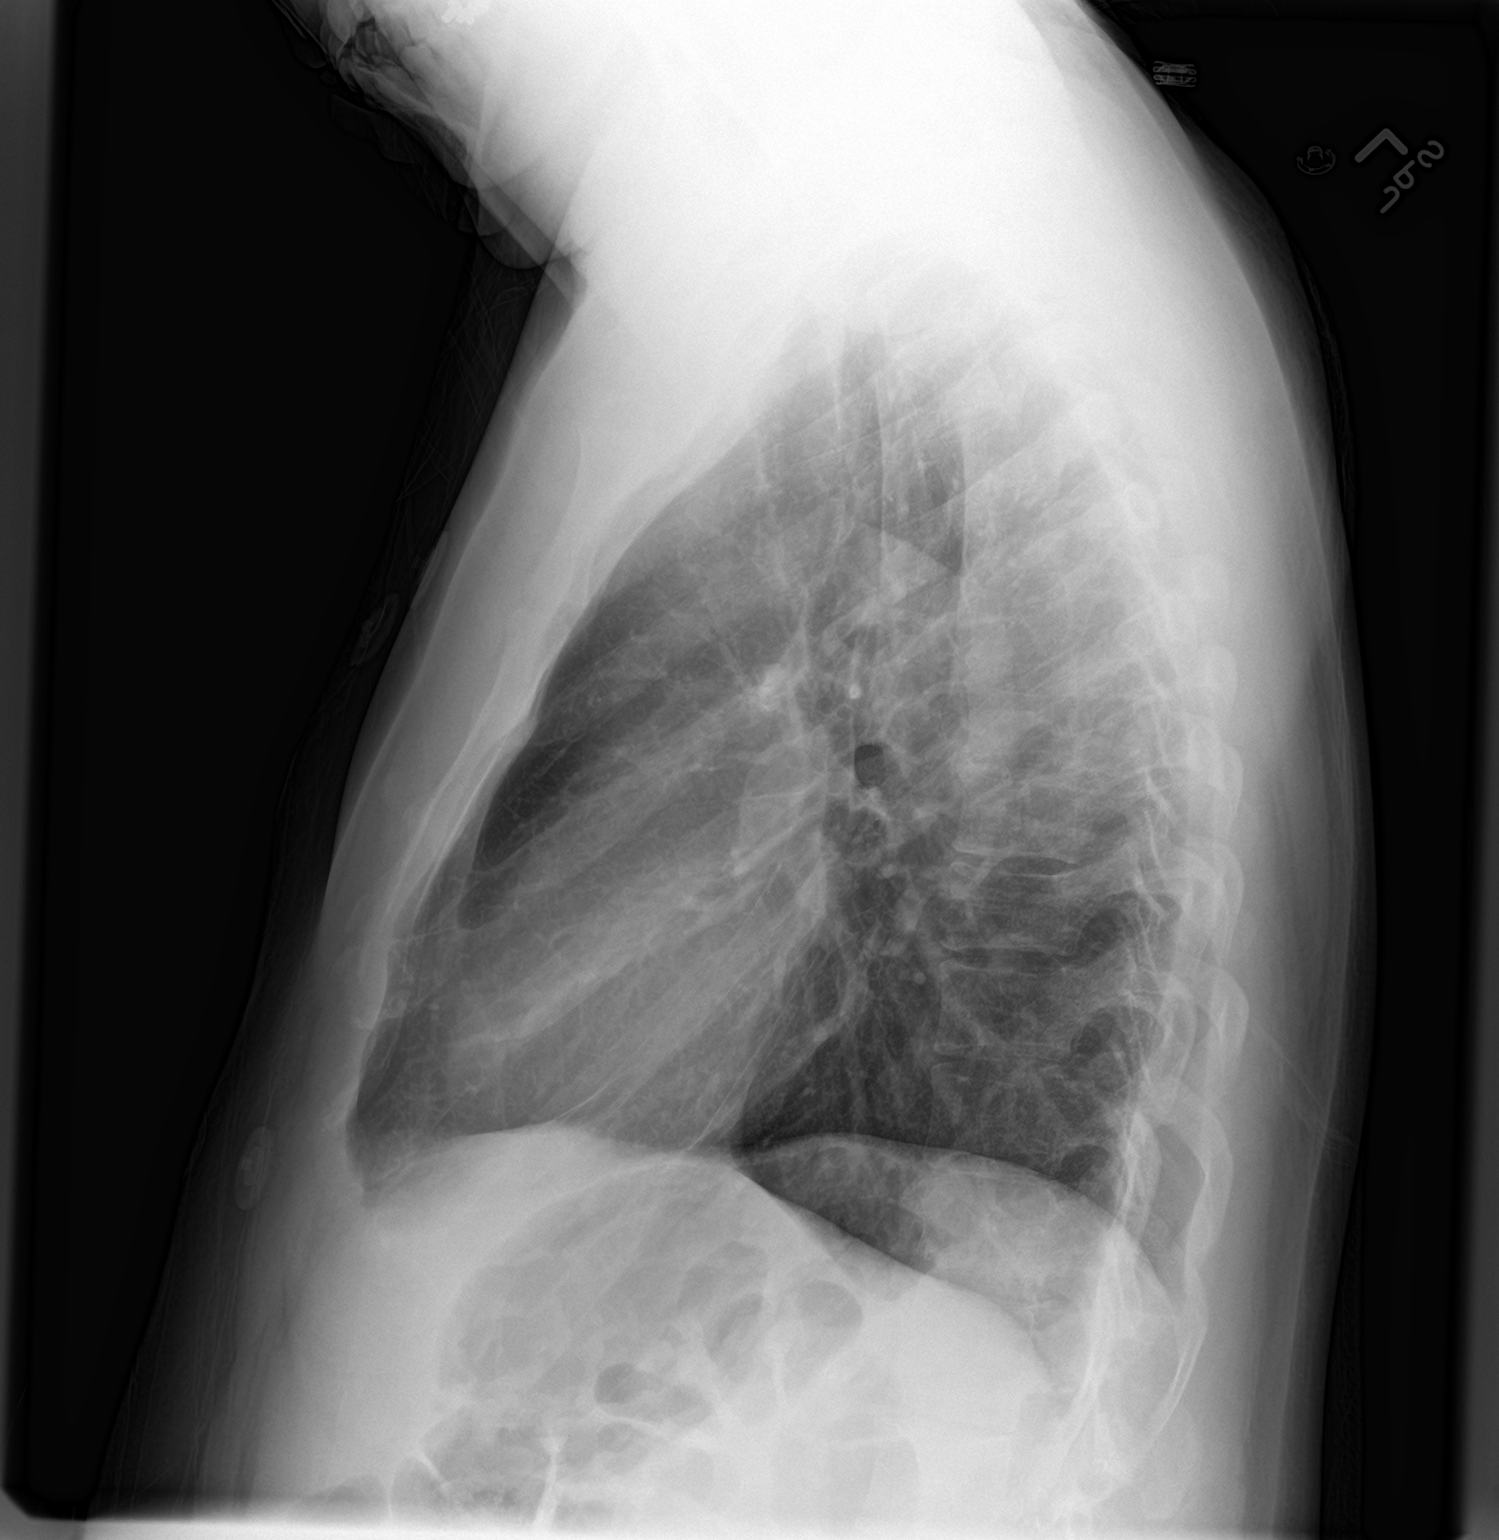

[2 of 2 positions shown; findings below may reference images not displayed]

FINDINGS: The lungs are well-aerated. Pulmonary vascularity is at the upper
limits of normal. There is no evidence of focal opacification,
pleural effusion or pneumothorax.

The heart is normal in size; the mediastinal contour is within
normal limits. No acute osseous abnormalities are seen.
IMPRESSION: No acute cardiopulmonary process seen.
